# Patient Record
Sex: Male | Born: 1955 | Race: Black or African American | Hispanic: No | Marital: Married | Smoking: Never smoker
Health system: Southern US, Community
[De-identification: ages and names within clinical notes are randomized; demographics above are authoritative.]

## PROBLEM LIST (undated history)

## (undated) DIAGNOSIS — D649 Anemia, unspecified: Secondary | ICD-10-CM

## (undated) DIAGNOSIS — I10 Essential (primary) hypertension: Secondary | ICD-10-CM

## (undated) DIAGNOSIS — C801 Malignant (primary) neoplasm, unspecified: Secondary | ICD-10-CM

## (undated) HISTORY — PX: NECK SURGERY: SHX720

## (undated) HISTORY — DX: Malignant (primary) neoplasm, unspecified: C80.1

---

## 2003-05-14 ENCOUNTER — Encounter (HOSPITAL_COMMUNITY): Admission: RE | Admit: 2003-05-14 | Discharge: 2003-06-13 | Payer: Self-pay | Admitting: Anesthesiology

## 2003-07-09 ENCOUNTER — Encounter: Payer: Self-pay | Admitting: General Surgery

## 2003-07-09 ENCOUNTER — Ambulatory Visit (HOSPITAL_COMMUNITY): Admission: RE | Admit: 2003-07-09 | Discharge: 2003-07-09 | Payer: Self-pay | Admitting: General Surgery

## 2009-02-28 ENCOUNTER — Encounter: Payer: Self-pay | Admitting: Emergency Medicine

## 2009-02-28 ENCOUNTER — Inpatient Hospital Stay (HOSPITAL_COMMUNITY): Admission: EM | Admit: 2009-02-28 | Discharge: 2009-03-02 | Payer: Self-pay | Admitting: Emergency Medicine

## 2009-03-14 ENCOUNTER — Encounter: Admission: RE | Admit: 2009-03-14 | Discharge: 2009-03-14 | Payer: Self-pay | Admitting: Neurosurgery

## 2009-03-15 ENCOUNTER — Emergency Department (HOSPITAL_COMMUNITY): Admission: EM | Admit: 2009-03-15 | Discharge: 2009-03-15 | Payer: Self-pay | Admitting: Emergency Medicine

## 2009-04-02 ENCOUNTER — Encounter: Admission: RE | Admit: 2009-04-02 | Discharge: 2009-04-02 | Payer: Self-pay | Admitting: Neurosurgery

## 2009-05-03 ENCOUNTER — Inpatient Hospital Stay (HOSPITAL_COMMUNITY): Admission: RE | Admit: 2009-05-03 | Discharge: 2009-05-04 | Payer: Self-pay | Admitting: Neurosurgery

## 2009-08-09 ENCOUNTER — Encounter: Admission: RE | Admit: 2009-08-09 | Discharge: 2009-08-09 | Payer: Self-pay | Admitting: Neurosurgery

## 2010-03-06 ENCOUNTER — Emergency Department (HOSPITAL_COMMUNITY): Admission: EM | Admit: 2010-03-06 | Discharge: 2010-03-06 | Payer: Self-pay | Admitting: Emergency Medicine

## 2010-09-19 ENCOUNTER — Emergency Department (HOSPITAL_COMMUNITY)
Admission: EM | Admit: 2010-09-19 | Discharge: 2010-09-19 | Payer: Self-pay | Source: Home / Self Care | Admitting: Emergency Medicine

## 2010-12-07 LAB — CBC
Hemoglobin: 12.4 g/dL — ABNORMAL LOW (ref 13.0–17.0)
RBC: 4.05 MIL/uL — ABNORMAL LOW (ref 4.22–5.81)
RDW: 14.1 % (ref 11.5–15.5)

## 2010-12-07 LAB — DIFFERENTIAL
Basophils Absolute: 0.1 10*3/uL (ref 0.0–0.1)
Lymphocytes Relative: 43 % (ref 12–46)
Monocytes Absolute: 0.5 10*3/uL (ref 0.1–1.0)
Monocytes Relative: 12 % (ref 3–12)
Neutro Abs: 1.5 10*3/uL — ABNORMAL LOW (ref 1.7–7.7)
Neutrophils Relative %: 37 % — ABNORMAL LOW (ref 43–77)

## 2010-12-07 LAB — BASIC METABOLIC PANEL
Calcium: 8.7 mg/dL (ref 8.4–10.5)
GFR calc Af Amer: >60 mL/min (ref >60)
GFR calc non Af Amer: >60 mL/min (ref >60)
Sodium: 135 mEq/L (ref 135–145)

## 2010-12-27 LAB — BASIC METABOLIC PANEL
BUN: 11 mg/dL (ref 6–23)
Chloride: 101 mEq/L (ref 96–112)
Potassium: 4.5 mEq/L (ref 3.5–5.1)
Sodium: 138 mEq/L (ref 135–145)

## 2010-12-27 LAB — CBC
HCT: 39.4 % (ref 39.0–52.0)
Hemoglobin: 13.5 g/dL (ref 13.0–17.0)
MCV: 90 fL (ref 78.0–100.0)
Platelets: 185 10*3/uL (ref 150–400)
WBC: 2.9 10*3/uL — ABNORMAL LOW (ref 4.0–10.5)

## 2010-12-29 LAB — APTT: aPTT: 29 seconds (ref 24–37)

## 2010-12-29 LAB — COMPREHENSIVE METABOLIC PANEL
Albumin: 3.7 g/dL (ref 3.5–5.2)
BUN: 19 mg/dL (ref 6–23)
Calcium: 8.7 mg/dL (ref 8.4–10.5)
Creatinine, Ser: 0.98 mg/dL (ref 0.4–1.5)
Glucose, Bld: 103 mg/dL — ABNORMAL HIGH (ref 70–99)
Potassium: 4.1 mEq/L (ref 3.5–5.1)
Total Protein: 7.6 g/dL (ref 6.0–8.3)

## 2010-12-29 LAB — DIFFERENTIAL
Lymphocytes Relative: 22 % (ref 12–46)
Lymphs Abs: 0.9 10*3/uL (ref 0.7–4.0)
Monocytes Absolute: 0.5 10*3/uL (ref 0.1–1.0)
Monocytes Relative: 12 % (ref 3–12)
Neutro Abs: 2.5 10*3/uL (ref 1.7–7.7)
Neutrophils Relative %: 63 % (ref 43–77)

## 2010-12-29 LAB — CBC
HCT: 39.3 % (ref 39.0–52.0)
Hemoglobin: 13.6 g/dL (ref 13.0–17.0)
MCHC: 34.7 g/dL (ref 30.0–36.0)
MCV: 90.8 fL (ref 78.0–100.0)
Platelets: 189 10*3/uL (ref 150–400)
RDW: 13.8 % (ref 11.5–15.5)

## 2010-12-29 LAB — PROTIME-INR: INR: 1.1 (ref 0.00–1.49)

## 2011-02-03 NOTE — Op Note (Signed)
NAMEJALIEN, Juan Wall               ACCOUNT NO.:  [id number]   MEDICAL RECORD NO.:  [id number]          PATIENT TYPE:  INP   LOCATION:  3534                         FACILITY:  [facility]   PHYSICIAN:  Donalee Citrin, M.D.        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   PROCEDURE:  Anterior cervical diskectomy and fusion at C3-4 using a 7-mm  allograft wedge and a 27-mm Venture plate with four 30-mm variable angle  screws.   SURGEON:  Donalee Citrin, MD   ASSISTANT:  Reinaldo Meeker, MD   ANESTHESIA:  General endotracheal.   HISTORY OF PRESENT ILLNESS:  The patient is a 53-year gentleman, who has  had progressive worsening neck pain, bilateral arm pain with numbness  and tingling in his right arm, and weakness in his right hand.  MRI scan  showed multilevel spondylosis; however, there was a large spondylitic  spur at C3-4 causing significant spinal cord compression and cervical  stenosis with signal change within the cord.  This was felt to be the  source of his symptomatology, and due to the degree of stenosis and its  clinical presentation, the patient was recommended anterior cervical  diskectomy and fusion.  The risks and benefits of the operation were  explained to the patient.  He understood and agreed to proceed forth.   DESCRIPTION OF PROCEDURE:  The patient was brought to the OR, induced  under general anesthesia, positioned supine, and the neck flexed in  extension with 5 pounds of Halter traction.  The right side of the neck  was prepped and draped in usual sterile fashion.  Preop x-ray localized  the appropriate level.  A curvilinear incision was made just off the  midline to the anterior border of the sternocleidomastoid.  The  superficial layer  of the platysma was dissected out and divided  longitudinally.  The avascular plane between the sternocleidomastoid and  strap muscles was developed down to the prevertebral fascia.  Prevertebral fascia was then dissected with Kittners.  Intraoperative x-  ray confirmed localization at the C3-4 disk space, so anterior  osteophytes bitten off C3-4 disk space.  The longus colli was reflected  laterally and self-retaining retractors were placed.  Annulotomy was  then incised and anterior disk was removed.  A 2- and 3-mm Kerrison  punches was used to remove out the anterior osteophytes coming off the  C3 vertebral body, and then a high-speed drill was used to drill down  the disk space down the posterior annulus and osteophyte complex.  At  this point, the operating room microscope was draped, brought into the  field and under microscopic illumination, further drilling was carried  out drilling off the posterior bone spur coming off the C3 vertebral  body.  Then, using a 1-mm Kerrison punch, this was used to get up  underneath the endplates both C3  and C4 removing the posterior annulus,  the large bone spur, and the PLL in piecemeal fashion.  This exposed the  thecal sac, marching across to both C4 pedicles, lateral margins were  identified.  There were some very large spur coming off the C3 vertebral  body, this was all aggressively underbitten decompressing the central  canal and thecal sac.  At the end of the diskectomy, there was no  further stenosis.  Both C4 and foramina were patent, and all posterior  spurs had been aggressively underbitten of both endplates.  Then, a size  7-mm graft was inserted.  Meticulous hemostasis was maintained.  Plate  was sized, and a 27 plate had to be used due to the size of the  interspace and the contouring of vertebral body.  Postop fluoroscopy  confirmed good position of plate, screws, and bone graft.  All screws  had excellent purchase.  Locking  mechanism was engaged.  The plate did  ride up a little bit high on the C3 vertebral body, however, due to the  size of the vertebral body, it was felt it was better to leave it in its  current position rather than try to reposition of the screws inferiorly  as it was felt which may compromise the integrity of the vertebral body.  So, at this point, the wound was copiously irrigated.  Meticulous  hemostasis was maintained.  The wound was closed in layers with  interrupted Vicryl, and the skin was closed with running 4-0  subcuticular.  Benzoin and Steri-Strips applied.  The patient went to  recovery room in stable condition.           ______________________________  Donalee Citrin, M.D.     GC/MEDQ  D:  05/03/2009  T:  05/03/2009  Job:  [id number]
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
## 2011-02-25 ENCOUNTER — Telehealth: Payer: Self-pay

## 2011-02-25 DIAGNOSIS — Z139 Encounter for screening, unspecified: Secondary | ICD-10-CM

## 2011-02-27 NOTE — Telephone Encounter (Signed)
LMOM for pt to call. 

## 2011-03-02 NOTE — Telephone Encounter (Addendum)
Gastroenterology Pre-Procedure Form  Request Date: 02/24/2011  ,  Requesting Physician: Darlene @ [facility]     PATIENT INFORMATION:  Juan Wall is a 55 y.o., male (DOB=03-12-1956).  PROCEDURE: Procedure(s) requested: colonoscopy Procedure Reason: screening for colon cancer  PATIENT REVIEW QUESTIONS: The patient reports the following:   1. Diabetes Melitis: no 2. Joint replacements in the past 12 months: no 3. Major health problems in the past 3 months: no 4. Has an artificial valve or MVP:no 5. Has been advised in past to take antibiotics in advance of a procedure like teeth cleaning: no}    MEDICATIONS & ALLERGIES:    Patient reports the following regarding taking any blood thinners:   Plavix? no Aspirin?no Coumadin?  no  Patient confirms/reports the following medications:  Current Outpatient Prescriptions  Medication Sig Dispense Refill  . losartan-hydrochlorothiazide (HYZAAR) 100-25 MG per tablet Take 1 tablet by mouth daily.        . methadone (DOLOPHINE) 10 MG tablet Take 10 mg by mouth every 6 (six) hours as needed. Takes for chronic nerve damage in his back       . nortriptyline (PAMELOR) 25 MG capsule Take 25 mg by mouth at bedtime. Take two tablets at bedtime         Patient confirms/reports the following allergies:  No Known Allergies  Patient is appropriate to schedule for requested procedure(s): yes  AUTHORIZATION INFORMATION Primary Insurance: Medicare   ID #: [id number] Pre-Cert / Auth required  Secondary Insurance: ,  ID #: ,  Group #:  Pre-Cert / Auth required: Pre-Cert / Auth #:   No orders of the defined types were placed in this encounter.    SCHEDULE INFORMATION: Procedure has been scheduled as follows:  Date: 03/17/2011    Time: 8:15 AM  Location: [facility] Short Stay  This Gastroenterology Pre-Precedure Form is being routed to the following provider(s) for review: Jonette Eva, MD   [facility] FOR KIM THAT PT MAY  NEED PHENERGAN RX AND INSTRUCTIONS MAILED TO PT

## 2011-03-04 NOTE — Telephone Encounter (Signed)
MOVIPREP. MAY NEED PHENERGAN

## 2011-03-17 ENCOUNTER — Ambulatory Visit (HOSPITAL_COMMUNITY): Admission: RE | Admit: 2011-03-17 | Payer: Medicare Other | Source: Ambulatory Visit | Admitting: Gastroenterology

## 2011-03-17 ENCOUNTER — Encounter: Payer: Self-pay | Admitting: Gastroenterology

## 2011-04-03 ENCOUNTER — Telehealth: Payer: Self-pay

## 2011-04-03 NOTE — Telephone Encounter (Signed)
Gastroenterology Pre-Procedure Form  Request Date: 03/04/2011  ,  Requesting Physician:  Agustin Cree @ [facility] in [facility]    PT IS REQUESTING THE PILL PREP   PATIENT INFORMATION:  Juan Wall is a 55 y.o., male (DOB=08-28-1956).  PROCEDURE: Procedure(s) requested: colonoscopy Procedure Reason: screening for colon cancer  PATIENT REVIEW QUESTIONS: The patient reports the following:   1. Diabetes Melitis: no 2. Joint replacements in the past 12 months: no 3. Major health problems in the past 3 months: no 4. Has an artificial valve or MVP:no 5. Has been advised in past to take antibiotics in advance of a procedure like teeth cleaning: no}    MEDICATIONS & ALLERGIES:    Patient reports the following regarding taking any blood thinners:   Plavix? no Aspirin?no Coumadin?  no  Patient confirms/reports the following medications:  Current Outpatient Prescriptions  Medication Sig Dispense Refill  . losartan-hydrochlorothiazide (HYZAAR) 100-25 MG per tablet Take 1 tablet by mouth daily.        . methadone (DOLOPHINE) 10 MG tablet Take 10 mg by mouth every 6 (six) hours as needed. Takes for chronic nerve damage in his back       . nortriptyline (PAMELOR) 25 MG capsule Take 25 mg by mouth at bedtime. Take two tablets at bedtime        Patient confirms/reports the following allergies:  No Known Allergies  Patient is appropriate to schedule for requested procedure(s): yes  AUTHORIZATION INFORMATION Primary Insurance: ,  ID #: ,  Group #:  Pre-Cert / Auth required:  Pre-Cert / Auth #:  Secondary Insurance: ,  ID #: ,  Group #:  Pre-Cert / Auth required: Pre-Cert / Auth #:   No orders of the defined types were placed in this encounter.    SCHEDULE INFORMATION: Procedure has been scheduled as follows:  Date: 04/13/2011  , Time: 12:30 pm  Location: [facility] Short Stay  This Gastroenterology Pre-Precedure Form is being routed to the following provider(s) for  review: Jonette Eva, MD

## 2011-04-04 NOTE — Telephone Encounter (Signed)
osmoprep

## 2011-04-07 MED ORDER — SOD PHOS MONO-SOD PHOS DIBASIC 1.102-0.398 G PO TABS: ORAL_TABLET | ORAL | Status: DC

## 2011-04-07 NOTE — Telephone Encounter (Signed)
[facility] Rx has been sent to [facility] and instructions are being placed in the mail today.

## 2011-04-10 MED ORDER — SODIUM CHLORIDE 0.45 % IV SOLN
Freq: Once | INTRAVENOUS | Status: AC
  Administered 2011-04-13: 12:00:00 via INTRAVENOUS

## 2011-04-13 ENCOUNTER — Ambulatory Visit (HOSPITAL_COMMUNITY)
Admission: RE | Admit: 2011-04-13 | Discharge: 2011-04-13 | Disposition: A | Discharge status: Home / Self Care | Payer: Medicare Other | Source: Ambulatory Visit | Attending: Gastroenterology | Admitting: Gastroenterology

## 2011-04-13 ENCOUNTER — Encounter (HOSPITAL_COMMUNITY)
Admission: RE | Disposition: A | Discharge status: Home / Self Care | Payer: Self-pay | Source: Ambulatory Visit | Attending: Gastroenterology

## 2011-04-13 ENCOUNTER — Encounter: Payer: Medicare Other | Admitting: Gastroenterology

## 2011-04-13 DIAGNOSIS — K648 Other hemorrhoids: Secondary | ICD-10-CM | POA: Insufficient documentation

## 2011-04-13 DIAGNOSIS — K5730 Diverticulosis of large intestine without perforation or abscess without bleeding: Secondary | ICD-10-CM | POA: Insufficient documentation

## 2011-04-13 DIAGNOSIS — Z1211 Encounter for screening for malignant neoplasm of colon: Secondary | ICD-10-CM

## 2011-04-13 DIAGNOSIS — I10 Essential (primary) hypertension: Secondary | ICD-10-CM | POA: Insufficient documentation

## 2011-04-13 DIAGNOSIS — Z79899 Other long term (current) drug therapy: Secondary | ICD-10-CM | POA: Insufficient documentation

## 2011-04-13 HISTORY — PX: COLONOSCOPY: SHX5424

## 2011-04-13 SURGERY — COLONOSCOPY
Anesthesia: Moderate Sedation

## 2011-04-13 MED ORDER — MEPERIDINE HCL 100 MG/ML IJ SOLN
INTRAMUSCULAR | Status: AC
  Filled 2011-04-13: qty 2

## 2011-04-13 MED ORDER — MIDAZOLAM HCL 5 MG/5ML IJ SOLN
INTRAMUSCULAR | Status: DC | PRN
  Administered 2011-04-13 (×2): 2 mg via INTRAVENOUS

## 2011-04-13 MED ORDER — PROMETHAZINE HCL 25 MG/ML IJ SOLN
INTRAMUSCULAR | Status: DC | PRN
  Administered 2011-04-13: 12.5 mg via INTRAVENOUS

## 2011-04-13 MED ORDER — MEPERIDINE HCL 100 MG/ML IJ SOLN
INTRAMUSCULAR | Status: DC | PRN
  Administered 2011-04-13 (×2): 50 mg via INTRAVENOUS

## 2011-04-13 MED ORDER — MIDAZOLAM HCL 5 MG/5ML IJ SOLN
INTRAMUSCULAR | Status: AC
  Filled 2011-04-13: qty 10

## 2011-04-13 NOTE — H&P (Signed)
  Primary Care Physician:  [facility], [facility], [place] Primary Gastroenterologist:  Dr. Darrick Penna  Pre-Procedure History & Physical: HPI:  Juan Wall is a 55 y.o. male is here for a screening colonoscopy.   PMHX: CHRONIC BACK PAIN HTN  No past surgical history on file.  Prior to Admission medications   Medication Sig Start Date End Date Taking? Authorizing Provider  losartan-hydrochlorothiazide (HYZAAR) 100-25 MG per tablet Take 1 tablet by mouth daily.     Yes Historical Provider, MD  methadone (DOLOPHINE) 10 MG tablet Take 10 mg by mouth every 6 (six) hours as needed. Takes for chronic nerve damage in his back    Yes Historical Provider, MD  nortriptyline (PAMELOR) 25 MG capsule Take 25 mg by mouth at bedtime. Take two tablets at bedtime   Yes Historical Provider, MD  sodium phosphates (OSMOPREP) 1.102-0.398 G TABS Use as directed Also buy 1 fleet enema to use as directed 04/07/11   Arlyce Harman, MD    Allergies as of 04/03/2011  . (No Known Allergies)    NO COLON CA OR POLYPS  History   Social History  . Marital Status: Married   Review of Systems: See HPI, otherwise negative ROS  Physical Exam: BP 135/87  Pulse 63  Temp(Src) 98.6 F (37 C) (Oral)  Resp 21  Ht 5\' 9"  (1.753 m)  Wt 84.369 kg (186 lb)  BMI 27.47 kg/m2  SpO2 99% General:   Alert,  pleasant and cooperative in NAD Head:  Normocephalic and atraumatic. Neck:  Supple; no masses or thyromegaly. Lungs:  Clear throughout to auscultation.    Heart:  Regular rate and rhythm. Abdomen:  Soft, nontender and nondistended. Normal bowel sounds, without guarding, and without rebound.   Neurologic:  Alert and  oriented x4;  grossly normal neurologically.  Impression/Plan: Juan Wall is now here to undergo a screening colonoscopy.  Risks, benefits, limitations, and alternatives regarding colonoscopy have been reviewed with the patient. Questions have been answered.

## 2011-04-21 ENCOUNTER — Encounter (HOSPITAL_COMMUNITY): Payer: Self-pay | Admitting: Gastroenterology

## 2012-01-07 DIAGNOSIS — G4700 Insomnia, unspecified: Secondary | ICD-10-CM | POA: Insufficient documentation

## 2012-04-20 DIAGNOSIS — R011 Cardiac murmur, unspecified: Secondary | ICD-10-CM | POA: Insufficient documentation

## 2012-08-26 DIAGNOSIS — M47819 Spondylosis without myelopathy or radiculopathy, site unspecified: Secondary | ICD-10-CM | POA: Insufficient documentation

## 2014-04-06 DIAGNOSIS — M961 Postlaminectomy syndrome, not elsewhere classified: Secondary | ICD-10-CM | POA: Insufficient documentation

## 2015-04-09 DIAGNOSIS — E7800 Pure hypercholesterolemia, unspecified: Secondary | ICD-10-CM | POA: Insufficient documentation

## 2015-07-01 DIAGNOSIS — Z5181 Encounter for therapeutic drug level monitoring: Secondary | ICD-10-CM | POA: Insufficient documentation

## 2015-07-01 DIAGNOSIS — G479 Sleep disorder, unspecified: Secondary | ICD-10-CM | POA: Insufficient documentation

## 2015-08-28 DIAGNOSIS — G4719 Other hypersomnia: Secondary | ICD-10-CM | POA: Insufficient documentation

## 2015-10-10 DIAGNOSIS — Z862 Personal history of diseases of the blood and blood-forming organs and certain disorders involving the immune mechanism: Secondary | ICD-10-CM | POA: Insufficient documentation

## 2015-10-18 DIAGNOSIS — M5442 Lumbago with sciatica, left side: Secondary | ICD-10-CM | POA: Insufficient documentation

## 2015-10-18 DIAGNOSIS — G8929 Other chronic pain: Secondary | ICD-10-CM | POA: Insufficient documentation

## 2015-12-31 ENCOUNTER — Encounter: Payer: Self-pay | Admitting: Gastroenterology

## 2016-05-19 NOTE — Progress Notes (Deleted)
[facility] day:  05/19/2016  Chief Complaint: Juan Wall is a 60 y.o. male with leukopenia who is referred in consultation by Dr. Astrid Divine for assessment and management.  HPI: ***  CBC on 04/09/2016 revealed a hematocrit of 37, hemoglobin 12.8, was 222,000, white count 4300 with an ANC of 1400. Differential included 32% segs, 46% lymphs, 18% monocytes, and 5% eosinophils. Absolute monocyte count was 800.    CBC on 10/10/2015 revealed a hematocrit of 39, hemoglobin 13.5, platelets 238,000 white count 4600 with an ANC of extreme 100. Differential included 44% segs, 43% lymphs, 17% monocytes, and 6% eosinophils. Absolute monocyte count was 800.  Hepatitis C and HIV testing were negative on 10/02/2014.   No past medical history on file.  Past Surgical History:  Procedure Laterality Date  . COLONOSCOPY  04/13/2011   Procedure: COLONOSCOPY;  Surgeon: [name], MD;  Location: [facility];  Service: Endoscopy;  Laterality: N/A;    No family history on file.  Social History:  has no tobacco, alcohol, and drug history on file.  The patient is accompanied by *** alone today.  Allergies: No Known Allergies  Current Medications: Current Outpatient Prescriptions  Medication Sig Dispense Refill  . losartan-hydrochlorothiazide (HYZAAR) 100-25 MG per tablet Take 1 tablet by mouth daily.      . methadone (DOLOPHINE) 10 MG tablet Take 10 mg by mouth every 6 (six) hours as needed. 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 nortriptyline (PAMELOR) 25 MG capsule Take 25 mg by mouth at bedtime. Take two tablets at bedtime     No current facility-administered medications for this visit.     Review of Systems:  GENERAL:  Feels good.  Active.  No fevers, sweats or weight loss. PERFORMANCE STATUS (ECOG):  *** HEENT:  No visual changes, runny nose, sore throat, mouth sores or tenderness. Lungs: No shortness of breath or cough.  No  hemoptysis. Cardiac:  No chest pain, palpitations, orthopnea, or PND. GI:  No nausea, vomiting, diarrhea, constipation, melena or hematochezia. GU:  No urgency, frequency, dysuria, or hematuria. Musculoskeletal:  No back pain.  No joint pain.  No muscle tenderness. Extremities:  No pain or swelling. Skin:  No rashes or skin changes. Neuro:  No headache, numbness or weakness, balance or coordination issues. Endocrine:  No diabetes, thyroid issues, hot flashes or night sweats. Psych:  No mood changes, depression or anxiety. Pain:  No focal pain. Review of systems:  All other systems reviewed and found to be negative.   Physical Exam: There were no vitals taken for this visit. GENERAL:  Well developed, well nourished, sitting comfortably in the exam room in no acute distress. MENTAL STATUS:  Alert and oriented to person, place and time. HEAD:  *** hair.  Normocephalic, atraumatic, face symmetric, no Cushingoid features. EYES:  *** eyes.  Pupils equal round and reactive to light and accomodation.  No conjunctivitis or scleral icterus. ENT:  Oropharynx clear without lesion.  Tongue normal. Mucous membranes moist.  RESPIRATORY:  Clear to auscultation without rales, wheezes or rhonchi. CARDIOVASCULAR:  Regular rate and rhythm without murmur, rub or gallop. ABDOMEN:  Soft, non-tender, with active bowel sounds, and no hepatosplenomegaly.  No masses. SKIN:  No rashes, ulcers or lesions. EXTREMITIES: No edema, no skin discoloration or tenderness.  No palpable cords. LYMPH NODES: No palpable cervical, supraclavicular, axillary or inguinal adenopathy  NEUROLOGICAL: Unremarkable. PSYCH:  Appropriate.  No visits  with results within 3 Day(s) from this visit.  Latest known visit with results is:  Hospital Outpatient Visit on 03/06/2010  Component Date Value Ref Range Status  . Sodium 12/07/2010 135  135 - 145 mEq/L Final  . Potassium 12/07/2010 3.9  3.5 - 5.1 mEq/L Final  . Chloride 12/07/2010 104   96 - 112 mEq/L Final  . CO2 12/07/2010 26  19 - 32 mEq/L Final  . Glucose, Bld 12/07/2010 104* 70 - 99 mg/dL Final  . BUN 12/07/2010 12  6 - 23 mg/dL Final  . Creatinine, Ser 12/07/2010 0.97  0.4 - 1.5 mg/dL Final  . Calcium 12/07/2010 8.7  8.4 - 10.5 mg/dL Final  . GFR calc non Af Amer 12/07/2010 >60  >60 mL/min Final  . GFR calc Af Amer 12/07/2010   >60 mL/min Final                   Value:>60                                The eGFR has been calculated                         using the MDRD equation.                         This calculation has not been                         validated in all clinical                         situations.                         eGFR's persistently                         <60 mL/min signify                         possible Chronic Kidney Disease.  . WBC 12/07/2010 4.1  4.0 - 10.5 K/uL Final  . RBC 12/07/2010 4.05* 4.22 - 5.81 MIL/uL Final  . Hemoglobin 12/07/2010 12.4* 13.0 - 17.0 g/dL Final  . HCT 12/07/2010 36.0* 39.0 - 52.0 % Final  . MCV 12/07/2010 88.9  78.0 - 100.0 fL Final  . MCHC 12/07/2010 34.3  30.0 - 36.0 g/dL Final  . RDW 12/07/2010 14.1  11.5 - 15.5 % Final  . Platelets 12/07/2010 189  150 - 400 K/uL Final  . Neutrophils Relative % 12/07/2010 37* 43 - 77 % Final  . Neutro Abs 12/07/2010 1.5* 1.7 - 7.7 K/uL Final  . Lymphocytes Relative 12/07/2010 43  12 - 46 % Final  . Lymphs Abs 12/07/2010 1.8  0.7 - 4.0 K/uL Final  . Monocytes Relative 12/07/2010 12  3 - 12 % Final  . Monocytes Absolute 12/07/2010 0.5  0.1 - 1.0 K/uL Final  . Eosinophils Relative 12/07/2010 5  0 - 5 % Final  . Eosinophils Absolute 12/07/2010 0.2  0.0 - 0.7 K/uL Final  . Basophils Relative 12/07/2010 2* 0 - 1 % Final  . Basophils Absolute 12/07/2010 0.1  0.0 - 0.1 K/uL Final    Assessment:  Juan Wall   is a 60 y.o. male ***  Plan: 1. *** 2. *** 3. *** 4. *** 5. ***  Lequita Asal, MD  05/19/2016, 2:12 PM

## 2016-05-20 ENCOUNTER — Inpatient Hospital Stay: Payer: Medicare Other | Attending: Hematology and Oncology | Admitting: Hematology and Oncology

## 2016-06-03 ENCOUNTER — Encounter: Payer: Self-pay | Admitting: Hematology and Oncology

## 2016-06-03 ENCOUNTER — Inpatient Hospital Stay: Payer: Medicare Other

## 2016-06-03 ENCOUNTER — Inpatient Hospital Stay: Payer: Medicare Other | Attending: Hematology and Oncology | Admitting: Hematology and Oncology

## 2016-06-03 VITALS — BP 152/90 | HR 84 | Temp 97.9°F | Resp 18 | Ht 69.5 in | Wt 191.1 lb

## 2016-06-03 DIAGNOSIS — D472 Monoclonal gammopathy: Secondary | ICD-10-CM | POA: Insufficient documentation

## 2016-06-03 DIAGNOSIS — K648 Other hemorrhoids: Secondary | ICD-10-CM | POA: Insufficient documentation

## 2016-06-03 DIAGNOSIS — M549 Dorsalgia, unspecified: Secondary | ICD-10-CM | POA: Insufficient documentation

## 2016-06-03 DIAGNOSIS — G8929 Other chronic pain: Secondary | ICD-10-CM | POA: Diagnosis not present

## 2016-06-03 DIAGNOSIS — D72819 Decreased white blood cell count, unspecified: Secondary | ICD-10-CM | POA: Diagnosis not present

## 2016-06-03 DIAGNOSIS — Z79899 Other long term (current) drug therapy: Secondary | ICD-10-CM | POA: Diagnosis not present

## 2016-06-03 DIAGNOSIS — K5730 Diverticulosis of large intestine without perforation or abscess without bleeding: Secondary | ICD-10-CM | POA: Diagnosis not present

## 2016-06-03 DIAGNOSIS — Z87891 Personal history of nicotine dependence: Secondary | ICD-10-CM | POA: Insufficient documentation

## 2016-06-03 DIAGNOSIS — D649 Anemia, unspecified: Secondary | ICD-10-CM | POA: Diagnosis not present

## 2016-06-03 LAB — RETICULOCYTES
RBC.: 4.37 MIL/uL — ABNORMAL LOW (ref 4.40–5.90)
Retic Count, Absolute: 48.1 10*3/uL (ref 19.0–183.0)
Retic Ct Pct: 1.1 % (ref 0.4–3.1)

## 2016-06-03 LAB — COMPREHENSIVE METABOLIC PANEL
ALT: 13 U/L — ABNORMAL LOW (ref 17–63)
AST: 17 U/L (ref 15–41)
Albumin: 4.2 g/dL (ref 3.5–5.0)
Alkaline Phosphatase: 63 U/L (ref 38–126)
Anion gap: 4 — ABNORMAL LOW (ref 5–15)
BUN: 12 mg/dL (ref 6–20)
CO2: 29 mmol/L (ref 22–32)
Calcium: 8.9 mg/dL (ref 8.9–10.3)
Chloride: 102 mmol/L (ref 101–111)
Creatinine, Ser: 1.08 mg/dL (ref 0.61–1.24)
GFR calc Af Amer: >60 mL/min (ref >60)
GFR calc non Af Amer: >60 mL/min (ref >60)
Glucose, Bld: 99 mg/dL (ref 65–99)
Potassium: 3.9 mmol/L (ref 3.5–5.1)
Sodium: 135 mmol/L (ref 135–145)
Total Bilirubin: 0.7 mg/dL (ref 0.3–1.2)
Total Protein: 8.2 g/dL — ABNORMAL HIGH (ref 6.5–8.1)

## 2016-06-03 LAB — TSH: TSH: 3.148 u[IU]/mL (ref 0.350–4.500)

## 2016-06-03 LAB — IRON AND TIBC
Iron: 52 ug/dL (ref 45–182)
Saturation Ratios: 19 % (ref 17.9–39.5)
TIBC: 276 ug/dL (ref 250–450)
UIBC: 224 ug/dL

## 2016-06-03 LAB — CBC WITH DIFFERENTIAL/PLATELET
Basophils Absolute: 0 10*3/uL (ref 0–0.1)
Basophils Relative: 1 %
Eosinophils Absolute: 0.1 10*3/uL (ref 0–0.7)
Eosinophils Relative: 5 %
HCT: 39.8 % — ABNORMAL LOW (ref 40.0–52.0)
Hemoglobin: 13.3 g/dL (ref 13.0–18.0)
Lymphocytes Relative: 39 %
Lymphs Abs: 1.2 10*3/uL (ref 1.0–3.6)
MCH: 29.7 pg (ref 26.0–34.0)
MCHC: 33.4 g/dL (ref 32.0–36.0)
MCV: 88.9 fL (ref 80.0–100.0)
Monocytes Absolute: 0.4 10*3/uL (ref 0.2–1.0)
Monocytes Relative: 12 %
Neutro Abs: 1.3 10*3/uL — ABNORMAL LOW (ref 1.4–6.5)
Neutrophils Relative %: 43 %
Platelets: 227 10*3/uL (ref 150–440)
RBC: 4.48 MIL/uL (ref 4.40–5.90)
RDW: 13.8 % (ref 11.5–14.5)
WBC: 3.1 10*3/uL — ABNORMAL LOW (ref 3.8–10.6)

## 2016-06-03 LAB — LACTATE DEHYDROGENASE: LDH: 137 U/L (ref 98–192)

## 2016-06-03 LAB — FOLATE: Folate: 24 ng/mL (ref >5.9)

## 2016-06-03 LAB — FERRITIN: Ferritin: 96 ng/mL (ref 24–336)

## 2016-06-03 NOTE — Progress Notes (Signed)
Patient here today as new evaluation regarding decreased WBC.  Referred by Dr. Astrid Divine.

## 2016-06-03 NOTE — Progress Notes (Signed)
[facility] day:  06/03/2016  Chief Complaint: Juan Wall is a 60 y.o. male with anemia and leukopenia who is referred in consultation by Dr. Gayland Curry for assessment and management.  HPI:  The patient notes no significant past medical history except for trauma in 1994 after falling from a height and landing on his back onto a concrete step.   With recent lab work, he was notes to have mild anemia and leukopenia.    CBC on 04/09/2016 revealed a hematocrit of 37, hemoglobin 12.8, was 222,000, white count 4300 with an ANC of 1400. Differential included 32% segs, 46% lymphs, 18% monocytes, and 5% eosinophils. Absolute monocyte count was 800.    CBC on 10/10/2015 revealed a hematocrit of 39.2, hemoglobin 13.5, platelets 238,000 white count 4600 with an ANC of 1600. Differential included 44% segs, 43% lymphs, 17% monocytes, and 6% eosinophils. Absolute monocyte count was 800.  CBC on 03/09/2014 revealed a hematocrit of 43, hemoglobin 14.6, WBC 3800 with an ANC of 1700.  Differential included 46% segs, 39% lymphs, 10% monocytes, 4% eosinophils, and 1% basophils.  Absolute monocyte count was 400.  CBC on 06/06/2013 revealed a hematocrit of 44, hemoglobin 15.0, WBC 3900 with an ANC of 1500.  Differential included 38% segs, 49% lymphs, 11% monocytes, and 2% eosinophils.  Absolute monocyte count was 400.  Hepatitis C and HIV testing were negative on 10/02/2014.  He denies any new medications or herbal products. He denies any fevers or infections.  He denies any sweats or weight loss.  He denies any alcohol or tobacco.  Regarding his diet, he states that it is stable. He does not eat breakfast. He will eat chicken twice a week, hamburger once a week and pork 1-2 times a week. He eats salads "now and then".  He craves sweets.  Patient denies any melena or hematochezia. He had a colonoscopy by Dr. Barney Drain on 04/13/2011.  He was noted to have  sigmoid diverticulosis and internal hemorrhoids.   History reviewed. No pertinent past medical history.  Past Surgical History:  Procedure Laterality Date  . COLONOSCOPY  04/13/2011   Procedure: COLONOSCOPY;  Surgeon: [name], MD;  Location: [facility];  Service: Endoscopy;  Laterality: N/A;    History reviewed. No pertinent family history.  Social History:  has no tobacco, alcohol, and drug history on file.  The patient lives in [place], [place].  The patient is alone today.  Allergies:  Allergies  Allergen Reactions  . Pregabalin Shortness Of Breath  . Atorvastatin Other (See Comments)    Current Medications: Current Outpatient Prescriptions  Medication Sig Dispense Refill  . amLODipine (NORVASC) 10 MG tablet Take by mouth.    . losartan-hydrochlorothiazide (HYZAAR) 100-25 MG per tablet Take 1 tablet by mouth daily.      . methadone (DOLOPHINE) 10 MG tablet Take 10 mg by mouth every 6 (six) hours as needed. 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 methadone (DOLOPHINE) 10 MG tablet Take by mouth.    . nortriptyline (PAMELOR) 25 MG capsule Take 25 mg by mouth at bedtime. Take two tablets at bedtime    . gabapentin (NEURONTIN) 600 MG tablet   9   No current facility-administered medications for this visit.     Review of Systems:  GENERAL:  Feels "ok".  Fever "now and then" at night.  No drenching sweats.  No weight loss. PERFORMANCE STATUS (ECOG):  1 HEENT:  No visual changes, runny nose, sore throat, mouth sores or tenderness. Lungs: No shortness of breath or cough.  No hemoptysis. Cardiac:  No chest pain, palpitations, orthopnea, or PND. GI:  No nausea, vomiting, diarrhea, constipation, melena or hematochezia. GU:  No urgency, frequency, dysuria, or hematuria. Musculoskeletal:  No back pain.  No joint pain.  No muscle tenderness. Extremities:  No pain or swelling. Skin:  No rashes or skin changes. Neuro:  Headache now and then  No numbness or weakness,  balance or coordination issues. Endocrine:  No diabetes, thyroid issues, hot flashes or night sweats. Psych:  No mood changes, depression or anxiety. Pain:  No focal pain. Review of systems:  All other systems reviewed and found to be negative.  Physical Exam: Blood pressure (!) 152/90, pulse 84, temperature 97.9 F (36.6 C), temperature source Tympanic, resp. rate 18, height 5' 9.5" (1.765 m), weight 191 lb 2.2 oz (86.7 kg). GENERAL:  Well developed, well nourished, gentleman sitting comfortably in the exam room with his left leg stretched out in no acute distress. MENTAL STATUS:  Alert and oriented to person, place and time. HEAD:  Wearing a bandana.  Black hair with goatee.  Normocephalic, atraumatic, face symmetric, no Cushingoid features. EYES:  Brown eyes.  Pupils equal round and reactive to light and accomodation.  No conjunctivitis or scleral icterus. ENT:  Oropharynx clear without lesion.  Poor dentition.  Tongue normal. Mucous membranes moist.  RESPIRATORY:  Clear to auscultation without rales, wheezes or rhonchi. CARDIOVASCULAR:  Regular rate and rhythm without murmur, rub or gallop. ABDOMEN:  Soft, non-tender, with active bowel sounds, and no appreciable hepatosplenomegaly.  No masses. SKIN:  No rashes, ulcers or lesions. EXTREMITIES: No edema, no skin discoloration or tenderness.  No palpable cords. LYMPH NODES: Questionable bilateral axillary fullness.  No palpable cervical, supraclavicular, or inguinal adenopathy  NEUROLOGICAL: Unremarkable. PSYCH:  Appropriate.   No visits with results within 3 Day(s) from this visit.  Latest known visit with results is:  Hospital Outpatient Visit on 03/06/2010  Component Date Value Ref Range Status  . Sodium 12/07/2010 135  135 - 145 mEq/L Final  . Potassium 12/07/2010 3.9  3.5 - 5.1 mEq/L Final  . Chloride 12/07/2010 104  96 - 112 mEq/L Final  . CO2 12/07/2010 26  19 - 32 mEq/L Final  . Glucose, Bld 12/07/2010 104* 70 - 99 mg/dL  Final  . BUN 12/07/2010 12  6 - 23 mg/dL Final  . Creatinine, Ser 12/07/2010 0.97  0.4 - 1.5 mg/dL Final  . Calcium 12/07/2010 8.7  8.4 - 10.5 mg/dL Final  . GFR calc non Af Amer 12/07/2010 >60  >60 mL/min Final  . GFR calc Af Amer 12/07/2010   >60 mL/min Final                   Value:>60                                The eGFR has been calculated                         using the MDRD equation.                         This calculation has not been  validated in all clinical                         situations.                         eGFR's persistently                         <60 mL/min signify                         possible Chronic Kidney Disease.  . WBC 12/07/2010 4.1  4.0 - 10.5 K/uL Final  . RBC 12/07/2010 4.05* 4.22 - 5.81 MIL/uL Final  . Hemoglobin 12/07/2010 12.4* 13.0 - 17.0 g/dL Final  . HCT 12/07/2010 36.0* 39.0 - 52.0 % Final  . MCV 12/07/2010 88.9  78.0 - 100.0 fL Final  . MCHC 12/07/2010 34.3  30.0 - 36.0 g/dL Final  . RDW 12/07/2010 14.1  11.5 - 15.5 % Final  . Platelets 12/07/2010 189  150 - 400 K/uL Final  . Neutrophils Relative % 12/07/2010 37* 43 - 77 % Final  . Neutro Abs 12/07/2010 1.5* 1.7 - 7.7 K/uL Final  . Lymphocytes Relative 12/07/2010 43  12 - 46 % Final  . Lymphs Abs 12/07/2010 1.8  0.7 - 4.0 K/uL Final  . Monocytes Relative 12/07/2010 12  3 - 12 % Final  . Monocytes Absolute 12/07/2010 0.5  0.1 - 1.0 K/uL Final  . Eosinophils Relative 12/07/2010 5  0 - 5 % Final  . Eosinophils Absolute 12/07/2010 0.2  0.0 - 0.7 K/uL Final  . Basophils Relative 12/07/2010 2* 0 - 1 % Final  . Basophils Absolute 12/07/2010 0.1  0.0 - 0.1 K/uL Final    Assessment:  Juan Wall is a 60 y.o. male with new normocytic anemia since 2017.  He has mild leukopenia.  Diet is good.  He denies any new medications or herbal products. He denies any fevers or infections.  He denies any sweats or weight loss.  He denies any alcohol or tobacco.  Colonoscopy  on 04/13/2011 revealed sigmoid diverticulosis and internal hemorrhoids.  He denies any melena or hematochezia.   Hepatitis C and HIV testing were negative on 10/02/2014.  Symptomatically, he has chronic back pain.  He has rare fevers at night.  Weight is stable.  Exam reveals bilateral axillary fullness.  Plan: 1.  Discuss anemia and leukopenia.  Discuss differential.  Discuss laboratory evaluation. 2.  Labs today:  CBC with diff, CMP, retic, ferritin, iron stores, B12, folate, TSH, hepatitis B core antibody, ANA with reflex, SPEP, free light chains, LDH. 3.  Schedule abdominal ultrasound: assess liver and spleen. 4.  RTC in 1 week for review of work-up and discussion regarding direction of therapy.   Lequita Asal, MD  06/03/2016, 3:33 PM

## 2016-06-04 LAB — ANA W/REFLEX IF POSITIVE: Anti Nuclear Antibody(ANA): NEGATIVE

## 2016-06-04 LAB — PROTEIN ELECTROPHORESIS, SERUM
A/G Ratio: 1.1 (ref 0.7–1.7)
Albumin ELP: 4.1 g/dL (ref 2.9–4.4)
Alpha-1-Globulin: 0.2 g/dL (ref 0.0–0.4)
Alpha-2-Globulin: 0.5 g/dL (ref 0.4–1.0)
Beta Globulin: 1 g/dL (ref 0.7–1.3)
Gamma Globulin: 2 g/dL — ABNORMAL HIGH (ref 0.4–1.8)
Globulin, Total: 3.7 g/dL (ref 2.2–3.9)
M-Spike, %: 1.5 g/dL — ABNORMAL HIGH
Total Protein ELP: 7.8 g/dL (ref 6.0–8.5)

## 2016-06-04 LAB — VITAMIN B12: Vitamin B-12: 1463 pg/mL — ABNORMAL HIGH (ref 180–914)

## 2016-06-04 LAB — KAPPA/LAMBDA LIGHT CHAINS
Kappa free light chain: 15.6 mg/L (ref 3.3–19.4)
Kappa, lambda light chain ratio: 0.01 — ABNORMAL LOW (ref 0.26–1.65)
Lambda free light chains: 1056.1 mg/L — ABNORMAL HIGH (ref 5.7–26.3)

## 2016-06-04 LAB — HEPATITIS B CORE ANTIBODY, TOTAL: Hep B Core Total Ab: NEGATIVE

## 2016-06-06 ENCOUNTER — Encounter: Payer: Self-pay | Admitting: Hematology and Oncology

## 2016-06-08 LAB — PATHOLOGIST SMEAR REVIEW

## 2016-06-09 ENCOUNTER — Ambulatory Visit
Admission: RE | Admit: 2016-06-09 | Discharge: 2016-06-09 | Disposition: A | Discharge status: Home / Self Care | Payer: Medicare Other | Source: Ambulatory Visit | Attending: Hematology and Oncology | Admitting: Hematology and Oncology

## 2016-06-09 DIAGNOSIS — D72819 Decreased white blood cell count, unspecified: Secondary | ICD-10-CM | POA: Insufficient documentation

## 2016-06-10 ENCOUNTER — Inpatient Hospital Stay: Payer: Medicare Other | Admitting: Hematology and Oncology

## 2016-06-10 NOTE — Progress Notes (Unsigned)
[facility] day:  06/10/2016  Chief Complaint: Juan Wall is a 60 y.o. male with anemia and leukopenia who is seen for review of work-up and discussion regarding direction of therapy.  HPI:  The patient was last seen in the medical oncology clinic on 06/03/2016.  At that time, he was seen for initial consultation regarding anemia and leukopenia.  Diet was good.  He denied any new medications or herbal products. He denied any alcohol or tobacco.  He denied any fevers or infections.  He denied any sweats or weight loss.    Colonoscopy was negative.  He denied any melena or hematochezia.  Symptomatically, he had chronic back pain.  He had rare fevers at night.  Weight was stable.  Exam revealed bilateral axillary fullness.  He underwent a work-up.  CBC revealed a hematocrit of 39.8, hemoglobin 13.3, platelets 227,000, white count 3100 with an ANC of 1300.  CMP revealed a protein of 8.2 (6.5-8.1), albumen 4.2, creatinine 1.08, and calcium 8.9.  Reticulocyte count was 1.1%.  Ferritin was 96.  Iron studies revealed a saturation of 19% and TIBC 276.  B12 was 1463.  Folate was 24.  TSh was 3.148.  Hepatitis B core antibody was negative.  ANA was negative.  LDH was 137.  SPEP revealed a 1.5 gm/dL monoclonal spike in the gamma region.  Lamda free light chains were 1,056.1 (5.7-26.3), kappa free light chains 15.6 with a kappa/lambda ratio of 0.01 (0.26-1.65).  Abdominal ultrasound on 06/09/2016 revealed a normal liver and spleen.  No past medical history on file.  Past Surgical History:  Procedure Laterality Date  . COLONOSCOPY  04/13/2011   Procedure: COLONOSCOPY;  Surgeon: [name], MD;  Location[facility];  Service: Endoscopy;  Laterality: N/A;    No family history on file.  Social History:  reports that he quit smoking about 47 years ago. He has never used smokeless tobacco. He reports that he does not drink alcohol. His drug history is  not on file.  The patient lives in [place], [place].  The patient is alone today.  Allergies:  Allergies  Allergen Reactions  . Pregabalin Shortness Of Breath  . Atorvastatin Other (See Comments)    Current Medications: Current Outpatient Prescriptions  Medication Sig Dispense Refill  . amLODipine (NORVASC) 10 MG tablet Take by mouth.    . gabapentin (NEURONTIN) 600 MG tablet   9  . losartan-hydrochlorothiazide (HYZAAR) 100-25 MG per tablet Take 1 tablet by mouth daily.      . methadone (DOLOPHINE) 10 MG tablet Take 10 mg by mouth every 6 (six) hours as needed. 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 methadone (DOLOPHINE) 10 MG tablet Take by mouth.    . nortriptyline (PAMELOR) 25 MG capsule Take 25 mg by mouth at bedtime. Take two tablets at bedtime     No current facility-administered medications for this visit.     Review of Systems:  GENERAL:  Feels "ok".  Fever "now and then" at night.  No drenching sweats.  No weight loss. PERFORMANCE STATUS (ECOG):  1 HEENT:  No visual changes, runny nose, sore throat, mouth sores or tenderness. Lungs: No shortness of breath or cough.  No hemoptysis. Cardiac:  No chest pain, palpitations, orthopnea, or PND. GI:  No nausea, vomiting, diarrhea, constipation, melena or hematochezia. GU:  No urgency, frequency, dysuria, or hematuria. Musculoskeletal:  No back pain.  No joint pain.  No muscle tenderness. Extremities:  No pain or swelling. Skin:  No rashes or skin changes. Neuro:  Headache now and then  No numbness or weakness, balance or coordination issues. Endocrine:  No diabetes, thyroid issues, hot flashes or night sweats. Psych:  No mood changes, depression or anxiety. Pain:  No focal pain. Review of systems:  All other systems reviewed and found to be negative.  Physical Exam: There were no vitals taken for this visit. GENERAL:  Well developed, well nourished, gentleman sitting comfortably in the exam room with his left leg  stretched out in no acute distress. MENTAL STATUS:  Alert and oriented to person, place and time. HEAD:  Wearing a bandana.  Black hair with goatee.  Normocephalic, atraumatic, face symmetric, no Cushingoid features. EYES:  Brown eyes.  Pupils equal round and reactive to light and accomodation.  No conjunctivitis or scleral icterus. ENT:  Oropharynx clear without lesion.  Poor dentition.  Tongue normal. Mucous membranes moist.  RESPIRATORY:  Clear to auscultation without rales, wheezes or rhonchi. CARDIOVASCULAR:  Regular rate and rhythm without murmur, rub or gallop. ABDOMEN:  Soft, non-tender, with active bowel sounds, and no appreciable hepatosplenomegaly.  No masses. SKIN:  No rashes, ulcers or lesions. EXTREMITIES: No edema, no skin discoloration or tenderness.  No palpable cords. LYMPH NODES: Questionable bilateral axillary fullness.  No palpable cervical, supraclavicular, or inguinal adenopathy  NEUROLOGICAL: Unremarkable. PSYCH:  Appropriate.   No visits with results within 3 Day(s) from this visit.  Latest known visit with results is:  Office Visit on 06/03/2016  Component Date Value Ref Range Status  . WBC 06/03/2016 3.1* 3.8 - 10.6 K/uL Final  . RBC 06/03/2016 4.48  4.40 - 5.90 MIL/uL Final  . Hemoglobin 06/03/2016 13.3  13.0 - 18.0 g/dL Final  . HCT 06/03/2016 39.8* 40.0 - 52.0 % Final  . MCV 06/03/2016 88.9  80.0 - 100.0 fL Final  . MCH 06/03/2016 29.7  26.0 - 34.0 pg Final  . MCHC 06/03/2016 33.4  32.0 - 36.0 g/dL Final  . RDW 06/03/2016 13.8  11.5 - 14.5 % Final  . Platelets 06/03/2016 227  150 - 440 K/uL Final  . Neutrophils Relative % 06/03/2016 43%  % Final  . Neutro Abs 06/03/2016 1.3* 1.4 - 6.5 K/uL Final  . Lymphocytes Relative 06/03/2016 39%  % Final  . Lymphs Abs 06/03/2016 1.2  1.0 - 3.6 K/uL Final  . Monocytes Relative 06/03/2016 12%  % Final  . Monocytes Absolute 06/03/2016 0.4  0.2 - 1.0 K/uL Final  . Eosinophils Relative 06/03/2016 5%  % Final  .  Eosinophils Absolute 06/03/2016 0.1  0 - 0.7 K/uL Final  . Basophils Relative 06/03/2016 1%  % Final  . Basophils Absolute 06/03/2016 0.0  0 - 0.1 K/uL Final  . Sodium 06/03/2016 135  135 - 145 mmol/L Final  . Potassium 06/03/2016 3.9  3.5 - 5.1 mmol/L Final  . Chloride 06/03/2016 102  101 - 111 mmol/L Final  . CO2 06/03/2016 29  22 - 32 mmol/L Final  . Glucose, Bld 06/03/2016 99  65 - 99 mg/dL Final  . BUN 06/03/2016 12  6 - 20 mg/dL Final  . Creatinine, Ser 06/03/2016 1.08  0.61 - 1.24 mg/dL Final  . Calcium 06/03/2016 8.9  8.9 - 10.3 mg/dL Final  . Total Protein 06/03/2016 8.2* 6.5 - 8.1 g/dL Final  . Albumin 06/03/2016 4.2  3.5 - 5.0 g/dL Final  . AST 06/03/2016 17  15 - 41 U/L Final  .  ALT 06/03/2016 13* 17 - 63 U/L Final  . Alkaline Phosphatase 06/03/2016 63  38 - 126 U/L Final  . Total Bilirubin 06/03/2016 0.7  0.3 - 1.2 mg/dL Final  . GFR calc non Af Amer 06/03/2016 >60  >60 mL/min Final  . GFR calc Af Amer 06/03/2016 >60  >60 mL/min Final   Comment: (NOTE) The eGFR has been calculated using the CKD EPI equation. This calculation has not been validated in all clinical situations. eGFR's persistently <60 mL/min signify possible Chronic Kidney Disease.   . Anion gap 06/03/2016 4* 5 - 15 Final  . Retic Ct Pct 06/03/2016 1.1  0.4 - 3.1 % Final  . RBC. 06/03/2016 4.37* 4.40 - 5.90 MIL/uL Final  . Retic Count, Manual 06/03/2016 48.1  19.0 - 183.0 K/uL Final  . Ferritin 06/03/2016 96  24 - 336 ng/mL Final  . Iron 06/03/2016 52  45 - 182 ug/dL Final  . TIBC 06/03/2016 276  250 - 450 ug/dL Final  . Saturation Ratios 06/03/2016 19  17.9 - 39.5 % Final  . UIBC 06/03/2016 224  ug/dL Final  . Vitamin B-12 06/04/2016 1463* 180 - 914 pg/mL Final   Comment: (NOTE) This assay is not validated for testing neonatal or myeloproliferative syndrome specimens for Vitamin B12 levels. Performed at [facility]   . Folate 06/03/2016 24.0  >5.9 ng/mL Final  . TSH 06/03/2016 3.148  0.350  - 4.500 uIU/mL Final  . Hep B Core Total Ab 06/04/2016 Negative  Negative Final   Comment: (NOTE) Performed At: [facility] [place], [place] [id number] Lindon Romp MD [id number]   . Anit Nuclear Antibody(ANA) 06/04/2016 Negative  Negative Final   Comment: (NOTE) Performed At: [facility] [place], [place] [id number] Lindon Romp MD [id number]   . Path Review 06/08/2016 Peripheral blood smear reveals mild leukopenia with neutropenia.   Final   Comment: Platelet and RBC indices and morphologies are unremarkable. Recent lab results reveals a M spike with lambda free light chains. Hematology service is following.  Reviewed by Dellia Nims Rubinas, M.D.   . Total Protein ELP 06/04/2016 7.8  6.0 - 8.5 g/dL Final  . Albumin ELP 06/04/2016 4.1  2.9 - 4.4 g/dL Final  . Alpha-1-Globulin 06/04/2016 0.2  0.0 - 0.4 g/dL Final  . Alpha-2-Globulin 06/04/2016 0.5  0.4 - 1.0 g/dL Final  . Beta Globulin 06/04/2016 1.0  0.7 - 1.3 g/dL Final  . Gamma Globulin 06/04/2016 2.0* 0.4 - 1.8 g/dL Final  . M-Spike, % 06/04/2016 1.5* Not Observed g/dL Final  . SPE Interp. 06/04/2016 Comment   Final   Comment: (NOTE) The SPE pattern demonstrates a single peak (M-spike) in the gamma region which may represent monoclonal protein. This peak may also be caused by circulating immune complexes, cryoglobulins, C-reactive protein, fibrinogen or hemolysis.  If clinically indicated, the presence of a monoclonal gammopathy may be confirmed by immuno- fixation, as well as an evaluation of the urine for the presence of Bence-Jones protein. Performed At: [facility] [place], [place] [id number] Lindon Romp MD [id number]   . Comment 06/04/2016 Comment   Final   Comment: (NOTE) Protein electrophoresis scan will follow via computer, mail, or courier delivery.   [name] GLOBULIN, TOTAL 06/04/2016 3.7  2.2 - 3.9 g/dL Corrected  . A/G Ratio  06/04/2016 1.1  0.7 - 1.7 Corrected  . Kappa free light chain 06/04/2016 15.6  3.3 - 19.4 mg/L Final  .  Lamda free light chains 06/04/2016 1056.1* 5.7 - 26.3 mg/L Final  . Kappa, lamda light chain ratio 06/04/2016 0.01* 0.26 - 1.65 Final   Comment: (NOTE) Performed At: [facility] [place], [place] [id number] Lindon Romp MD [id number]   . LDH 06/03/2016 137  98 - 192 U/L Final    Assessment:  Juan Wall is a 60 y.o. male with new normocytic anemia since 2017.  He has mild leukopenia.  Diet is good.  He denies any new medications or herbal products. He denies any fevers or infections.  He denies any sweats or weight loss.  He denies any alcohol or tobacco.  Colonoscopy on 04/13/2011 revealed sigmoid diverticulosis and internal hemorrhoids.  He denies any melena or hematochezia.   Hepatitis C and HIV testing were negative on 10/02/2014.  Symptomatically, he has chronic back pain.  He has rare fevers at night.  Weight is stable.  Exam reveals bilateral axillary fullness.  Plan: 1.      Lequita Asal, MD  06/10/2016, 5:42 AM

## 2016-06-17 ENCOUNTER — Encounter: Payer: Self-pay | Admitting: Hematology and Oncology

## 2016-06-17 ENCOUNTER — Inpatient Hospital Stay (HOSPITAL_BASED_OUTPATIENT_CLINIC_OR_DEPARTMENT_OTHER): Payer: Medicare Other | Admitting: Hematology and Oncology

## 2016-06-17 ENCOUNTER — Ambulatory Visit
Admission: RE | Admit: 2016-06-17 | Discharge: 2016-06-17 | Disposition: A | Discharge status: Home / Self Care | Payer: Medicare Other | Source: Ambulatory Visit | Attending: Hematology and Oncology | Admitting: Hematology and Oncology

## 2016-06-17 VITALS — BP 134/83 | HR 67 | Temp 97.4°F | Wt 189.0 lb

## 2016-06-17 DIAGNOSIS — M549 Dorsalgia, unspecified: Secondary | ICD-10-CM | POA: Diagnosis not present

## 2016-06-17 DIAGNOSIS — D72819 Decreased white blood cell count, unspecified: Secondary | ICD-10-CM | POA: Diagnosis not present

## 2016-06-17 DIAGNOSIS — K5730 Diverticulosis of large intestine without perforation or abscess without bleeding: Secondary | ICD-10-CM

## 2016-06-17 DIAGNOSIS — Z79899 Other long term (current) drug therapy: Secondary | ICD-10-CM

## 2016-06-17 DIAGNOSIS — G8929 Other chronic pain: Secondary | ICD-10-CM

## 2016-06-17 DIAGNOSIS — D472 Monoclonal gammopathy: Secondary | ICD-10-CM | POA: Diagnosis not present

## 2016-06-17 DIAGNOSIS — D649 Anemia, unspecified: Secondary | ICD-10-CM

## 2016-06-17 DIAGNOSIS — Z87891 Personal history of nicotine dependence: Secondary | ICD-10-CM

## 2016-06-17 DIAGNOSIS — K648 Other hemorrhoids: Secondary | ICD-10-CM

## 2016-06-17 DIAGNOSIS — R937 Abnormal findings on diagnostic imaging of other parts of musculoskeletal system: Secondary | ICD-10-CM | POA: Diagnosis not present

## 2016-06-17 DIAGNOSIS — Z981 Arthrodesis status: Secondary | ICD-10-CM | POA: Diagnosis not present

## 2016-06-17 NOTE — Progress Notes (Signed)
Patient ambulates without assistance, brought to exam room 3, accompanied by his family member.  Patient c/o chronic lower back pain 10 oof 10.  BP 149/90, vitals documented

## 2016-06-17 NOTE — Progress Notes (Signed)
[facility] day:  06/17/2016  Chief Complaint: DORSE LOCY is a 60 y.o. male with anemia and leukopenia who is seen for review of work-up and discussion regarding direction of therapy.  HPI:  The patient was last seen in the medical oncology clinic on 06/03/2016.  At that time, he was seen for initial consultation regarding anemia and leukopenia.  Diet was good.  He denied any new medications or herbal products. He denied any alcohol or tobacco.  He denied any fevers or infections.  He denied any sweats or weight loss.    Colonoscopy in 2012 was negative.  He denied any melena or hematochezia.  Symptomatically, he had chronic back pain.  He had rare fevers at night.  Weight was stable.  Exam revealed bilateral axillary fullness.  He underwent a work-up.  CBC revealed a hematocrit of 39.8, hemoglobin 13.3, platelets 227,000, white count 3100 with an ANC of 1300.  CMP revealed a protein of 8.2 (6.5-8.1), albumen 4.2, creatinine 1.08, and calcium 8.9.  Reticulocyte count was 1.1%.  Ferritin was 96.  Iron studies revealed a saturation of 19% and TIBC 276.  B12 was 1463.  Folate was 24.  TSH was 3.148.  Hepatitis B core antibody was negative.  ANA was negative.  LDH was 137.  SPEP revealed a 1.5 gm/dL monoclonal spike in the gamma region.  Lambda free light chains were 1,056.1 (5.7 - 26.3), kappa free light chains 15.6 with a kappa/lambda ratio of 0.01 (0.26-1.65).  Abdominal ultrasound on 06/09/2016 revealed a normal liver and spleen.  Symptomatically, he notes chronic back pain post a fall onto a concrete step.  History reviewed. No pertinent past medical history.  Past Surgical History:  Procedure Laterality Date  . COLONOSCOPY  04/13/2011   Procedure: COLONOSCOPY;  Surgeon: [name], MD;  Location: [facility];  Service: Endoscopy;  Laterality: N/A;    History reviewed. No pertinent family history.  Social History:  reports that he  quit smoking about 47 years ago. He has never used smokeless tobacco. He reports that he does not drink alcohol. His drug history is not on file.  The patient lives in [place], [place].  The patient is accompanied by his girlfriend, Darrick Penna, today.  Allergies:  Allergies  Allergen Reactions  . Pregabalin Shortness Of Breath  . Atorvastatin Other (See Comments)    Current Medications: Current Outpatient Prescriptions  Medication Sig Dispense Refill  . amLODipine (NORVASC) 10 MG tablet Take by mouth.    . gabapentin (NEURONTIN) 600 MG tablet   9  . losartan-hydrochlorothiazide (HYZAAR) 100-25 MG per tablet Take 1 tablet by mouth daily.      . methadone (DOLOPHINE) 10 MG tablet Take 10 mg by mouth every 6 (six) hours as needed. 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 nortriptyline (PAMELOR) 25 MG capsule Take 25 mg by mouth at bedtime. Take two tablets at bedtime     No current facility-administered medications for this visit.     Review of Systems:  GENERAL:  Feels "the same".  No fever.  No drenching sweats.  Weight down 3 pounds. PERFORMANCE STATUS (ECOG):  1 HEENT:  No visual changes, runny nose, sore throat, mouth sores or tenderness. Lungs: No shortness of breath or cough.  No hemoptysis. Cardiac:  No chest pain, palpitations, orthopnea, or PND. GI:  No nausea, vomiting, diarrhea, constipation, melena or hematochezia. GU:  No urgency, frequency, dysuria, or  hematuria. Musculoskeletal:  Chronic back pain s/p fall off ladder onto concrete step.  No joint pain.  No muscle tenderness. Extremities:  No pain or swelling. Skin:  No rashes or skin changes. Neuro:  Headache (rare).  No numbness or weakness, balance or coordination issues. Endocrine:  No diabetes, thyroid issues, hot flashes or night sweats. Psych:  No mood changes, depression or anxiety. Pain:  No focal pain. Review of systems:  All other systems reviewed and found to be negative.  Physical Exam: Blood pressure  134/83, pulse 67, temperature 97.4 F (36.3 C), temperature source Tympanic, weight 188 lb 15.7 oz (85.7 kg). GENERAL:  Well developed, well nourished, gentleman slightly restless trying to find a comfortable position in the exam room with his left leg stretched out in no acute distress. MENTAL STATUS:  Alert and oriented to person, place and time. HEAD:  Black hair with goatee.  Normocephalic, atraumatic, face symmetric, no Cushingoid features. EYES:  Brown eyes.  No conjunctivitis or scleral icterus. NEUROLOGICAL: Unremarkable. PSYCH:  Appropriate.   No visits with results within 3 Day(s) from this visit.  Latest known visit with results is:  Office Visit on 06/03/2016  Component Date Value Ref Range Status  . WBC 06/03/2016 3.1* 3.8 - 10.6 K/uL Final  . RBC 06/03/2016 4.48  4.40 - 5.90 MIL/uL Final  . Hemoglobin 06/03/2016 13.3  13.0 - 18.0 g/dL Final  . HCT 06/03/2016 39.8* 40.0 - 52.0 % Final  . MCV 06/03/2016 88.9  80.0 - 100.0 fL Final  . MCH 06/03/2016 29.7  26.0 - 34.0 pg Final  . MCHC 06/03/2016 33.4  32.0 - 36.0 g/dL Final  . RDW 06/03/2016 13.8  11.5 - 14.5 % Final  . Platelets 06/03/2016 227  150 - 440 K/uL Final  . Neutrophils Relative % 06/03/2016 43%  % Final  . Neutro Abs 06/03/2016 1.3* 1.4 - 6.5 K/uL Final  . Lymphocytes Relative 06/03/2016 39%  % Final  . Lymphs Abs 06/03/2016 1.2  1.0 - 3.6 K/uL Final  . Monocytes Relative 06/03/2016 12%  % Final  . Monocytes Absolute 06/03/2016 0.4  0.2 - 1.0 K/uL Final  . Eosinophils Relative 06/03/2016 5%  % Final  . Eosinophils Absolute 06/03/2016 0.1  0 - 0.7 K/uL Final  . Basophils Relative 06/03/2016 1%  % Final  . Basophils Absolute 06/03/2016 0.0  0 - 0.1 K/uL Final  . Sodium 06/03/2016 135  135 - 145 mmol/L Final  . Potassium 06/03/2016 3.9  3.5 - 5.1 mmol/L Final  . Chloride 06/03/2016 102  101 - 111 mmol/L Final  . CO2 06/03/2016 29  22 - 32 mmol/L Final  . Glucose, Bld 06/03/2016 99  65 - 99 mg/dL Final  . BUN  06/03/2016 12  6 - 20 mg/dL Final  . Creatinine, Ser 06/03/2016 1.08  0.61 - 1.24 mg/dL Final  . Calcium 06/03/2016 8.9  8.9 - 10.3 mg/dL Final  . Total Protein 06/03/2016 8.2* 6.5 - 8.1 g/dL Final  . Albumin 06/03/2016 4.2  3.5 - 5.0 g/dL Final  . AST 06/03/2016 17  15 - 41 U/L Final  . ALT 06/03/2016 13* 17 - 63 U/L Final  . Alkaline Phosphatase 06/03/2016 63  38 - 126 U/L Final  . Total Bilirubin 06/03/2016 0.7  0.3 - 1.2 mg/dL Final  . GFR calc non Af Amer 06/03/2016 >60  >60 mL/min Final  . GFR calc Af Amer 06/03/2016 >60  >60 mL/min Final   Comment: (NOTE) The eGFR has  been calculated using the CKD EPI equation. This calculation has not been validated in all clinical situations. eGFR's persistently <60 mL/min signify possible Chronic Kidney Disease.   . Anion gap 06/03/2016 4* 5 - 15 Final  . Retic Ct Pct 06/03/2016 1.1  0.4 - 3.1 % Final  . RBC. 06/03/2016 4.37* 4.40 - 5.90 MIL/uL Final  . Retic Count, Manual 06/03/2016 48.1  19.0 - 183.0 K/uL Final  . Ferritin 06/03/2016 96  24 - 336 ng/mL Final  . Iron 06/03/2016 52  45 - 182 ug/dL Final  . TIBC 06/03/2016 276  250 - 450 ug/dL Final  . Saturation Ratios 06/03/2016 19  17.9 - 39.5 % Final  . UIBC 06/03/2016 224  ug/dL Final  . Vitamin B-12 06/04/2016 1463* 180 - 914 pg/mL Final   Comment: (NOTE) This assay is not validated for testing neonatal or myeloproliferative syndrome specimens for Vitamin B12 levels. Performed at [facility]   . Folate 06/03/2016 24.0  >5.9 ng/mL Final  . TSH 06/03/2016 3.148  0.350 - 4.500 uIU/mL Final  . Hep B Core Total Ab 06/04/2016 Negative  Negative Final   Comment: (NOTE) Performed At: [facility] [place], [place] [id number] Lindon Romp MD [id number]   . Anit Nuclear Antibody(ANA) 06/04/2016 Negative  Negative Final   Comment: (NOTE) Performed At: [facility] [place], [place] [id number] Lindon Romp MD [id number]    . Path Review 06/08/2016 Peripheral blood smear reveals mild leukopenia with neutropenia.   Final   Comment: Platelet and RBC indices and morphologies are unremarkable. Recent lab results reveals a M spike with lambda free light chains. Hematology service is following.  Reviewed by Dellia Nims Rubinas, M.D.   . Total Protein ELP 06/04/2016 7.8  6.0 - 8.5 g/dL Final  . Albumin ELP 06/04/2016 4.1  2.9 - 4.4 g/dL Final  . Alpha-1-Globulin 06/04/2016 0.2  0.0 - 0.4 g/dL Final  . Alpha-2-Globulin 06/04/2016 0.5  0.4 - 1.0 g/dL Final  . Beta Globulin 06/04/2016 1.0  0.7 - 1.3 g/dL Final  . Gamma Globulin 06/04/2016 2.0* 0.4 - 1.8 g/dL Final  . M-Spike, % 06/04/2016 1.5* Not Observed g/dL Final  . SPE Interp. 06/04/2016 Comment   Final   Comment: (NOTE) The SPE pattern demonstrates a single peak (M-spike) in the gamma region which may represent monoclonal protein. This peak may also be caused by circulating immune complexes, cryoglobulins, C-reactive protein, fibrinogen or hemolysis.  If clinically indicated, the presence of a monoclonal gammopathy may be confirmed by immuno- fixation, as well as an evaluation of the urine for the presence of Bence-Jones protein. Performed At: [facility] [place], [place] [id number] Lindon Romp MD [id number]   . Comment 06/04/2016 Comment   Final   Comment: (NOTE) Protein electrophoresis scan will follow via computer, mail, or courier delivery.   [name] GLOBULIN, TOTAL 06/04/2016 3.7  2.2 - 3.9 g/dL Corrected  . A/G Ratio 06/04/2016 1.1  0.7 - 1.7 Corrected  . Kappa free light chain 06/04/2016 15.6  3.3 - 19.4 mg/L Final  . Lamda free light chains 06/04/2016 1056.1* 5.7 - 26.3 mg/L Final  . Kappa, lamda light chain ratio 06/04/2016 0.01* 0.26 - 1.65 Final   Comment: (NOTE) Performed At: [facility] [place], [place] [id number] Lindon Romp MD [id number]:[id number]   . 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  Diet is good.  He denies any new medications or herbal products. He denies any fevers or infections.  He denies any sweats or weight loss.  He denies any alcohol or tobacco.  SPEP on 06/03/2016 revealed a 1.5 gm/dL monoclonal spike in the gamma region.  Lambda free light chains were 1,056.1 (5.7 - 26.3), kappa free light chains 15.6 with a kappa/lambda ratio of 0.01 (0.26-1.65).  Work-up on 06/03/2016 revealed a hematocrit of 39.8, hemoglobin 13.3, platelets 227,000, white count 3100 with an ANC of 1300.  Reticulocyte count was 1.1%.  Normal studies included creatinine (1.08), albumen (4.2), calcium (8.9), ferritin, iron studies, B12, folate, TSH, hepatitis B core antibody, ANA, and LDH.  Hepatitis C and HIV testing were negative on 10/02/2014.  Abdominal ultrasound on 06/09/2016 revealed a normal liver and spleen.  Colonoscopy on 04/13/2011 revealed sigmoid diverticulosis and internal hemorrhoids.  He denies any melena or hematochezia.   Symptomatically, he has chronic back pain.  He has rare fevers at night.  Weight is stable.  Exam reveals bilateral axillary fullness.  Plan: 1. Review work-up.  Discuss concern for a lymphoproliferative disorder such as myeloma or potential monoclonal gammopathy of unknown significance (MGUS).  Discuss stages of myeloma and indications for treatment.  Discuss bone survey and bone marrow aspirate and biopsy.  Discuss additional lab work.  Patient would like to perform additional lab studies prior to bone marrow biopsy.  2.  [facility] slip: immunofixation, beta-2 microglobulin.  24 hour UPEP with free light chains. 3.  Bone survey: r/o lytic lesions. 4.  RTC in 1 week for review of studies and further discussions regarding direction of therapy (bone marrow).   Lequita Asal, MD  06/17/2016, 5:31 PM

## 2016-06-21 ENCOUNTER — Encounter: Payer: Self-pay | Admitting: Hematology and Oncology

## 2016-06-23 ENCOUNTER — Encounter: Payer: Self-pay | Admitting: Hematology and Oncology

## 2016-06-24 ENCOUNTER — Encounter: Payer: Self-pay | Admitting: Hematology and Oncology

## 2016-06-24 ENCOUNTER — Inpatient Hospital Stay: Payer: Medicare Other | Attending: Hematology and Oncology | Admitting: Hematology and Oncology

## 2016-06-24 VITALS — BP 131/89 | HR 73 | Temp 96.1°F | Resp 18 | Wt 191.1 lb

## 2016-06-24 DIAGNOSIS — C9030 Solitary plasmacytoma not having achieved remission: Secondary | ICD-10-CM | POA: Diagnosis present

## 2016-06-24 DIAGNOSIS — K5730 Diverticulosis of large intestine without perforation or abscess without bleeding: Secondary | ICD-10-CM | POA: Diagnosis not present

## 2016-06-24 DIAGNOSIS — D72819 Decreased white blood cell count, unspecified: Secondary | ICD-10-CM | POA: Insufficient documentation

## 2016-06-24 DIAGNOSIS — D709 Neutropenia, unspecified: Secondary | ICD-10-CM

## 2016-06-24 DIAGNOSIS — I10 Essential (primary) hypertension: Secondary | ICD-10-CM | POA: Insufficient documentation

## 2016-06-24 DIAGNOSIS — M549 Dorsalgia, unspecified: Secondary | ICD-10-CM | POA: Diagnosis not present

## 2016-06-24 DIAGNOSIS — D649 Anemia, unspecified: Secondary | ICD-10-CM | POA: Diagnosis not present

## 2016-06-24 DIAGNOSIS — Z79899 Other long term (current) drug therapy: Secondary | ICD-10-CM | POA: Diagnosis not present

## 2016-06-24 DIAGNOSIS — D472 Monoclonal gammopathy: Secondary | ICD-10-CM | POA: Diagnosis not present

## 2016-06-24 DIAGNOSIS — Z87891 Personal history of nicotine dependence: Secondary | ICD-10-CM

## 2016-06-24 DIAGNOSIS — K648 Other hemorrhoids: Secondary | ICD-10-CM | POA: Insufficient documentation

## 2016-06-24 NOTE — Progress Notes (Signed)
[facility] day:  06/24/2016  Chief Complaint: CAMDYN BESKE is a 60 y.o. 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  HPI:  The patient was last seen in the medical oncology clinic on 06/17/2016.  At that time, work-up was reviewed. Labs were suggestive of a lymphoproliferative disorder such as myeloma or less likely monoclonal gammopathy of unknown significance (MGUS).  We discussed a bone marrow biopsy.  Decision was made to pursue additional testing prior to a bone marrow.  [facility] labs on 06/23/2016 included a beta2-microglobulin of 1.7 (0.6-2.4).  24 hour urine is unavailable.  Bone survey on 06/17/2016 revealed questionable very subtle small lucencies noted both humeri and femurs. Lesions may be related to osteopenia or tiny metastatic foci and/or multiple myeloma.  Symptomatically, he denies any new symptoms.  He has chronic back pain post a fall onto a concrete step.  History reviewed. No pertinent past medical history.  Past Surgical History:  Procedure Laterality Date  . COLONOSCOPY  04/13/2011   Procedure: COLONOSCOPY;  Surgeon: [name], MD;  Location: [facility];  Service: Endoscopy;  Laterality: N/A;    History reviewed. No pertinent family history.  Social History:  reports that he quit smoking about 47 years ago. He has never used smokeless tobacco. He reports that he does not drink alcohol. His drug history is not on file.  The patient lives in [place], [place].  The patient is accompanied by his girlfriend, Darrick Penna, today.  Allergies:  Allergies  Allergen Reactions  . Pregabalin Shortness Of Breath  . Atorvastatin Other (See Comments)    Current Medications: Current Outpatient Prescriptions  Medication Sig Dispense Refill  . gabapentin (NEURONTIN) 600 MG tablet   9  . losartan-hydrochlorothiazide (HYZAAR)  100-25 MG per tablet Take 1 tablet by mouth daily.      . methadone (DOLOPHINE) 10 MG tablet Take 10 mg by mouth every 6 (six) hours as needed. 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 nortriptyline (PAMELOR) 25 MG capsule Take 25 mg by mouth at bedtime. Take two tablets at bedtime    . amLODipine (NORVASC) 10 MG tablet Take by mouth.     No current facility-administered medications for this visit.     Review of Systems:  GENERAL:  Feels "ok".  No fevers or sweats.  Weight up 3 pounds. PERFORMANCE STATUS (ECOG):  1 HEENT:  No visual changes, runny nose, sore throat, mouth sores or tenderness. Lungs: No shortness of breath or cough.  No hemoptysis. Cardiac:  No chest pain, palpitations, orthopnea, or PND. GI:  No nausea, vomiting, diarrhea, constipation, melena or hematochezia. GU:  No urgency, frequency, dysuria, or hematuria. Musculoskeletal:  Chronic back pain s/p fall off ladder onto concrete step.  No joint pain.  No muscle tenderness. Extremities:  No pain or swelling. Skin:  No rashes or skin changes. Neuro:  No headache, numbness or weakness, balance or coordination issues. Endocrine:  No diabetes, thyroid issues, hot flashes or night sweats. Psych:  No mood changes, depression or anxiety. Pain:  No focal pain. Review of systems:  All other systems reviewed and found to be negative.  Physical Exam: Blood pressure 131/89, pulse 73, temperature (!) 96.1 F (35.6 C), temperature source Tympanic, resp. rate 18, weight 191 lb 2.2 oz (86.7 kg). GENERAL:  Well developed, well nourished, gentleman sitting in  the exam room with his left leg stretched out in no acute distress. MENTAL STATUS:  Alert and oriented to person, place and time. HEAD:  Black hair with goatee.  Normocephalic, atraumatic, face symmetric, no Cushingoid features. EYES:  Brown eyes.  No conjunctivitis or scleral icterus. NEUROLOGICAL: Unremarkable. PSYCH:  Appropriate.   No visits with results within 3  Day(s) from this visit.  Latest known visit with results is:  Office Visit on 06/03/2016  Component Date Value Ref Range Status  . WBC 06/03/2016 3.1* 3.8 - 10.6 K/uL Final  . RBC 06/03/2016 4.48  4.40 - 5.90 MIL/uL Final  . Hemoglobin 06/03/2016 13.3  13.0 - 18.0 g/dL Final  . HCT 06/03/2016 39.8* 40.0 - 52.0 % Final  . MCV 06/03/2016 88.9  80.0 - 100.0 fL Final  . MCH 06/03/2016 29.7  26.0 - 34.0 pg Final  . MCHC 06/03/2016 33.4  32.0 - 36.0 g/dL Final  . RDW 06/03/2016 13.8  11.5 - 14.5 % Final  . Platelets 06/03/2016 227  150 - 440 K/uL Final  . Neutrophils Relative % 06/03/2016 43%  % Final  . Neutro Abs 06/03/2016 1.3* 1.4 - 6.5 K/uL Final  . Lymphocytes Relative 06/03/2016 39%  % Final  . Lymphs Abs 06/03/2016 1.2  1.0 - 3.6 K/uL Final  . Monocytes Relative 06/03/2016 12%  % Final  . Monocytes Absolute 06/03/2016 0.4  0.2 - 1.0 K/uL Final  . Eosinophils Relative 06/03/2016 5%  % Final  . Eosinophils Absolute 06/03/2016 0.1  0 - 0.7 K/uL Final  . Basophils Relative 06/03/2016 1%  % Final  . Basophils Absolute 06/03/2016 0.0  0 - 0.1 K/uL Final  . Sodium 06/03/2016 135  135 - 145 mmol/L Final  . Potassium 06/03/2016 3.9  3.5 - 5.1 mmol/L Final  . Chloride 06/03/2016 102  101 - 111 mmol/L Final  . CO2 06/03/2016 29  22 - 32 mmol/L Final  . Glucose, Bld 06/03/2016 99  65 - 99 mg/dL Final  . BUN 06/03/2016 12  6 - 20 mg/dL Final  . Creatinine, Ser 06/03/2016 1.08  0.61 - 1.24 mg/dL Final  . Calcium 06/03/2016 8.9  8.9 - 10.3 mg/dL Final  . Total Protein 06/03/2016 8.2* 6.5 - 8.1 g/dL Final  . Albumin 06/03/2016 4.2  3.5 - 5.0 g/dL Final  . AST 06/03/2016 17  15 - 41 U/L Final  . ALT 06/03/2016 13* 17 - 63 U/L Final  . Alkaline Phosphatase 06/03/2016 63  38 - 126 U/L Final  . Total Bilirubin 06/03/2016 0.7  0.3 - 1.2 mg/dL Final  . GFR calc non Af Amer 06/03/2016 >60  >60 mL/min Final  . GFR calc Af Amer 06/03/2016 >60  >60 mL/min Final   Comment: (NOTE) The eGFR has been  calculated using the CKD EPI equation. This calculation has not been validated in all clinical situations. eGFR's persistently <60 mL/min signify possible Chronic Kidney Disease.   . Anion gap 06/03/2016 4* 5 - 15 Final  . Retic Ct Pct 06/03/2016 1.1  0.4 - 3.1 % Final  . RBC. 06/03/2016 4.37* 4.40 - 5.90 MIL/uL Final  . Retic Count, Manual 06/03/2016 48.1  19.0 - 183.0 K/uL Final  . Ferritin 06/03/2016 96  24 - 336 ng/mL Final  . Iron 06/03/2016 52  45 - 182 ug/dL Final  . TIBC 06/03/2016 276  250 - 450 ug/dL Final  . Saturation Ratios 06/03/2016 19  17.9 - 39.5 % Final  . UIBC 06/03/2016 224  ug/dL Final  . Vitamin B-12 06/04/2016 1463* 180 - 914 pg/mL Final   Comment: (NOTE) This assay is not validated for testing neonatal or myeloproliferative syndrome specimens for Vitamin B12 levels. Performed at [facility]   . Folate 06/03/2016 24.0  >5.9 ng/mL Final  . TSH 06/03/2016 3.148  0.350 - 4.500 uIU/mL Final  . Hep B Core Total Ab 06/04/2016 Negative  Negative Final   Comment: (NOTE) Performed At: [facility] [place], [place] [id number] Lindon Romp MD [id number]   . Anit Nuclear Antibody(ANA) 06/04/2016 Negative  Negative Final   Comment: (NOTE) Performed At: [facility] [place], [place] [id number] Lindon Romp MD OZ:[id number]   . Path Review 06/08/2016 Peripheral blood smear reveals mild leukopenia with neutropenia.   Final   Comment: Platelet and RBC indices and morphologies are unremarkable. Recent lab results reveals a M spike with lambda free light chains. Hematology service is following.  Reviewed by Dellia Nims Rubinas, M.D.   . Total Protein ELP 06/04/2016 7.8  6.0 - 8.5 g/dL Final  . Albumin ELP 06/04/2016 4.1  2.9 - 4.4 g/dL Final  . Alpha-1-Globulin 06/04/2016 0.2  0.0 - 0.4 g/dL Final  . Alpha-2-Globulin 06/04/2016 0.5  0.4 - 1.0 g/dL Final  . Beta Globulin 06/04/2016 1.0  0.7 - 1.3 g/dL Final   . Gamma Globulin 06/04/2016 2.0* 0.4 - 1.8 g/dL Final  . M-Spike, % 06/04/2016 1.5* Not Observed g/dL Final  . SPE Interp. 06/04/2016 Comment   Final   Comment: (NOTE) The SPE pattern demonstrates a single peak (M-spike) in the gamma region which may represent monoclonal protein. This peak may also be caused by circulating immune complexes, cryoglobulins, C-reactive protein, fibrinogen or hemolysis.  If clinically indicated, the presence of a monoclonal gammopathy may be confirmed by immuno- fixation, as well as an evaluation of the urine for the presence of Bence-Jones protein. Performed At: [facility] [place], [place] [id number] Lindon Romp MD [id number]:[id number]   . Comment 06/04/2016 Comment   Final   Comment: (NOTE) Protein electrophoresis scan will follow via computer, mail, or courier delivery.   [name] GLOBULIN, TOTAL 06/04/2016 3.7  2.2 - 3.9 g/dL Corrected  . A/G Ratio 06/04/2016 1.1  0.7 - 1.7 Corrected  . Kappa free light chain 06/04/2016 15.6  3.3 - 19.4 mg/L Final  . Lamda free light chains 06/04/2016 1056.1* 5.7 - 26.3 mg/L Final  . Kappa, lamda light chain ratio 06/04/2016 0.01* 0.26 - 1.65 Final   Comment: (NOTE) Performed At: [facility] [place], [place] [id number] Lindon Romp MD [id number]   . LDH 06/03/2016 137  98 - 192 U/L Final    Assessment:  REXTON GREULICH is a 59 y.o. male with a monoclonal gammopathy, normocytic anemia, and mild leukopenia.  Diet is good.  He denies any new medications or herbal products. He denies any fevers or infections.  He denies any sweats or weight loss.  He denies any alcohol or tobacco.  SPEP on 06/03/2016 revealed a 1.5 gm/dL monoclonal spike in the gamma region.  Lambda free light chains were 1,056.1 (5.7 - 26.3), kappa free light chains 15.6 with a kappa/lambda ratio of 0.01 (0.26-1.65).  Beta2-microglobulin was 1.7 (0.6-2.4) on 06/23/2016.  Work-up on 06/03/2016 revealed  a hematocrit of 39.8, hemoglobin 13.3, platelets 227,000, white count 3100 with an ANC of 1300.  Reticulocyte count was 1.1%.  Normal studies included creatinine (1.08), albumen (  4.2), calcium (8.9), ferritin, iron studies, B12, folate, TSH, hepatitis B core antibody, ANA, and LDH.  Hepatitis C and HIV testing were negative on 10/02/2014.  Abdominal ultrasound on 06/09/2016 revealed a normal liver and spleen.  Bone survey on 06/17/2016 revealed questionable very subtle small lucencies noted both humeri and femurs. Lesions may be related to osteopenia or tiny metastatic foci and/or multiple myeloma.  Colonoscopy on 04/13/2011 revealed sigmoid diverticulosis and internal hemorrhoids.  He denies any melena or hematochezia.   Symptomatically, he has chronic back pain.  He has rare fevers at night.  Exam reveals bilateral axillary fullness.  Plan: 1. Review work-up to date.  Discuss normal beta2-microglobulin.  Await 24 hour urine.  Discuss bone marrow aspirate and biopsy. 2.  Follow-up 24 hour urine for UPEP with free light chains. 3.  Schedule bone marrow aspirate and biopsy 4.  RTC 10 days after bone marrow.  Addendum:  24 hour urine on 06/21/2016 revealed 98 mg/24 hour of protein.  M-spike was 13.5% (13 mg/24 hours).  Immunofixation revealed an IgG monoclonal protein with lambda light chain specificity.   Lequita Asal, MD  06/24/2016, 10:57 AM

## 2016-06-26 ENCOUNTER — Telehealth: Payer: Self-pay | Admitting: *Deleted

## 2016-06-26 NOTE — Telephone Encounter (Signed)
Called patient to inform him that his Bone Marrow has been scheduled for 07-07-16 [facility].  Patient should arrive @ 7:30 am.  Patient will follow up with Dr. Mike Gip on 07-15-16 @ 10:00 am in the [facility] clinic. Verbalized understanding.

## 2016-07-07 ENCOUNTER — Ambulatory Visit
Admission: RE | Admit: 2016-07-07 | Discharge: 2016-07-07 | Disposition: A | Discharge status: Home / Self Care | Payer: Medicare Other | Source: Ambulatory Visit | Attending: Hematology and Oncology | Admitting: Hematology and Oncology

## 2016-07-07 ENCOUNTER — Encounter: Payer: Self-pay | Admitting: Hematology and Oncology

## 2016-07-07 DIAGNOSIS — C9000 Multiple myeloma not having achieved remission: Secondary | ICD-10-CM | POA: Insufficient documentation

## 2016-07-07 DIAGNOSIS — D709 Neutropenia, unspecified: Secondary | ICD-10-CM | POA: Insufficient documentation

## 2016-07-07 DIAGNOSIS — D649 Anemia, unspecified: Secondary | ICD-10-CM | POA: Diagnosis not present

## 2016-07-07 DIAGNOSIS — Z79899 Other long term (current) drug therapy: Secondary | ICD-10-CM | POA: Insufficient documentation

## 2016-07-07 DIAGNOSIS — D472 Monoclonal gammopathy: Secondary | ICD-10-CM

## 2016-07-07 HISTORY — DX: Anemia, unspecified: D64.9

## 2016-07-07 HISTORY — DX: Essential (primary) hypertension: I10

## 2016-07-07 LAB — DIFFERENTIAL
Basophils Absolute: 0 10*3/uL (ref 0–0.1)
Basophils Relative: 1 %
Eosinophils Absolute: 0.2 10*3/uL (ref 0–0.7)
Eosinophils Relative: 5 %
Lymphocytes Relative: 43 %
Lymphs Abs: 1.8 10*3/uL (ref 1.0–3.6)
Monocytes Absolute: 0.6 10*3/uL (ref 0.2–1.0)
Monocytes Relative: 15 %
Neutro Abs: 1.5 10*3/uL (ref 1.4–6.5)
Neutrophils Relative %: 36 %

## 2016-07-07 LAB — CBC
HCT: 36.7 % — ABNORMAL LOW (ref 40.0–52.0)
Hemoglobin: 12.8 g/dL — ABNORMAL LOW (ref 13.0–18.0)
MCH: 30.6 pg (ref 26.0–34.0)
MCHC: 34.7 g/dL (ref 32.0–36.0)
MCV: 88 fL (ref 80.0–100.0)
Platelets: 217 10*3/uL (ref 150–440)
RBC: 4.18 MIL/uL — ABNORMAL LOW (ref 4.40–5.90)
RDW: 13.9 % (ref 11.5–14.5)
WBC: 4.2 10*3/uL (ref 3.8–10.6)

## 2016-07-07 MED ORDER — HYDROCODONE-ACETAMINOPHEN 5-325 MG PO TABS
ORAL_TABLET | ORAL | Status: AC
  Filled 2016-07-07: qty 2

## 2016-07-07 MED ORDER — HYDROCODONE-ACETAMINOPHEN 5-325 MG PO TABS
1.0000 | ORAL_TABLET | ORAL | Status: DC | PRN
  Administered 2016-07-07: 2 via ORAL

## 2016-07-07 MED ORDER — MIDAZOLAM HCL 5 MG/5ML IJ SOLN
INTRAMUSCULAR | Status: AC | PRN
  Administered 2016-07-07: 0.5 mg via INTRAVENOUS
  Administered 2016-07-07: 1 mg via INTRAVENOUS
  Administered 2016-07-07: 0.5 mg via INTRAVENOUS
  Administered 2016-07-07 (×2): 1 mg via INTRAVENOUS

## 2016-07-07 MED ORDER — FENTANYL CITRATE (PF) 100 MCG/2ML IJ SOLN
INTRAMUSCULAR | Status: AC | PRN
  Administered 2016-07-07: 50 ug via INTRAVENOUS
  Administered 2016-07-07 (×2): 25 ug via INTRAVENOUS

## 2016-07-07 MED ORDER — SODIUM CHLORIDE 0.9 % IV SOLN
INTRAVENOUS | Status: DC
  Administered 2016-07-07: 09:00:00 via INTRAVENOUS
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
## 2016-07-07 NOTE — Procedures (Signed)
Interventional Radiology Procedure Note  Procedure: CT guided aspirate and core biopsy of right iliac bone Complications: None Recommendations: - Bedrest supine x 1 hr - Follow biopsy results  Maanya Hippert T. Kiandra Sanguinetti, M.D Pager:  [phone]   

## 2016-07-07 NOTE — Progress Notes (Signed)
Patient has not taken BP meds. X 2 days-to take medication when gets home.

## 2016-07-08 ENCOUNTER — Ambulatory Visit: Payer: Medicare Other | Admitting: Hematology and Oncology

## 2016-07-15 ENCOUNTER — Inpatient Hospital Stay (HOSPITAL_BASED_OUTPATIENT_CLINIC_OR_DEPARTMENT_OTHER): Payer: Medicare Other | Admitting: Hematology and Oncology

## 2016-07-15 ENCOUNTER — Encounter: Payer: Self-pay | Admitting: Hematology and Oncology

## 2016-07-15 VITALS — BP 131/76 | HR 64 | Temp 97.7°F | Resp 18 | Wt 194.7 lb

## 2016-07-15 DIAGNOSIS — D72819 Decreased white blood cell count, unspecified: Secondary | ICD-10-CM | POA: Diagnosis not present

## 2016-07-15 DIAGNOSIS — M549 Dorsalgia, unspecified: Secondary | ICD-10-CM | POA: Diagnosis not present

## 2016-07-15 DIAGNOSIS — C9000 Multiple myeloma not having achieved remission: Secondary | ICD-10-CM

## 2016-07-15 DIAGNOSIS — I10 Essential (primary) hypertension: Secondary | ICD-10-CM

## 2016-07-15 DIAGNOSIS — Z87891 Personal history of nicotine dependence: Secondary | ICD-10-CM

## 2016-07-15 DIAGNOSIS — K5730 Diverticulosis of large intestine without perforation or abscess without bleeding: Secondary | ICD-10-CM

## 2016-07-15 DIAGNOSIS — M899 Disorder of bone, unspecified: Secondary | ICD-10-CM

## 2016-07-15 DIAGNOSIS — C9030 Solitary plasmacytoma not having achieved remission: Secondary | ICD-10-CM | POA: Diagnosis not present

## 2016-07-15 DIAGNOSIS — D649 Anemia, unspecified: Secondary | ICD-10-CM

## 2016-07-15 DIAGNOSIS — Z79899 Other long term (current) drug therapy: Secondary | ICD-10-CM

## 2016-07-15 DIAGNOSIS — K648 Other hemorrhoids: Secondary | ICD-10-CM

## 2016-07-15 DIAGNOSIS — M949 Disorder of cartilage, unspecified: Principal | ICD-10-CM
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
## 2016-07-16 ENCOUNTER — Telehealth: Payer: Self-pay | Admitting: Hematology and Oncology

## 2016-07-16 NOTE — Telephone Encounter (Signed)
If pt decides to have labs here then we can order cbc with diff, met c, SPEP, and free light chains.  Ihe wants it at [facility] we can fill out paper and send to him in the mail. Will await pt to call back

## 2016-07-16 NOTE — Telephone Encounter (Signed)
Called patient to let him know that we would like to follow him in 3 months to see MD and get labs.  LVM for patient to return our call to let us know if he would like to have the labs drawn at the [facility] or if he prefers [facility]?  Instructed him to call us back to let us know where he prefers.

## 2016-07-16 NOTE — Telephone Encounter (Signed)
Pt received a call telling him he would have to pay his deductible before his PET and bone density scans. He can't afford to so he asked to cx both for now and will c/b to r/s when he gets insurance straightened out.

## 2016-07-20 ENCOUNTER — Ambulatory Visit: Admission: RE | Admit: 2016-07-20 | Payer: Medicare Other | Source: Ambulatory Visit

## 2016-07-21 NOTE — Telephone Encounter (Signed)
Called patient again today and LVM asking him to call back and let us know if he wants to come to [facility] for labs or if he wants to get them at [facility].

## 2016-07-22 ENCOUNTER — Ambulatory Visit: Payer: Medicare Other | Admitting: Hematology and Oncology

## 2016-07-27 NOTE — Telephone Encounter (Signed)
Called patient again today to ask him if he wants to have labs drawn her at [facility] or [facility].  Had to leave a voice message as patient is not answering his phone.

## 2016-10-29 DIAGNOSIS — D4989 Neoplasm of unspecified behavior of other specified sites: Secondary | ICD-10-CM | POA: Insufficient documentation

## 2016-12-08 DIAGNOSIS — D472 Monoclonal gammopathy: Secondary | ICD-10-CM | POA: Insufficient documentation

## 2016-12-08 DIAGNOSIS — C9000 Multiple myeloma not having achieved remission: Secondary | ICD-10-CM | POA: Insufficient documentation

## 2018-01-12 DIAGNOSIS — M25562 Pain in left knee: Secondary | ICD-10-CM | POA: Insufficient documentation

## 2018-06-29 DIAGNOSIS — N528 Other male erectile dysfunction: Secondary | ICD-10-CM | POA: Insufficient documentation

## 2018-06-29 DIAGNOSIS — I10 Essential (primary) hypertension: Secondary | ICD-10-CM | POA: Insufficient documentation

## 2018-10-27 ENCOUNTER — Inpatient Hospital Stay: Payer: Medicare Other | Admitting: Hematology and Oncology
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
## 2018-11-07 ENCOUNTER — Other Ambulatory Visit: Payer: Self-pay

## 2018-11-07 ENCOUNTER — Inpatient Hospital Stay: Payer: Medicare PPO | Attending: Hematology and Oncology | Admitting: Hematology and Oncology

## 2018-11-07 ENCOUNTER — Encounter: Payer: Self-pay | Admitting: Hematology and Oncology

## 2018-11-07 ENCOUNTER — Ambulatory Visit: Payer: Medicare PPO

## 2018-11-07 VITALS — BP 161/89 | HR 68 | Temp 97.9°F | Resp 16 | Ht 69.5 in | Wt 190.9 lb

## 2018-11-07 DIAGNOSIS — K648 Other hemorrhoids: Secondary | ICD-10-CM | POA: Diagnosis not present

## 2018-11-07 DIAGNOSIS — I10 Essential (primary) hypertension: Secondary | ICD-10-CM | POA: Diagnosis not present

## 2018-11-07 DIAGNOSIS — R61 Generalized hyperhidrosis: Secondary | ICD-10-CM | POA: Diagnosis not present

## 2018-11-07 DIAGNOSIS — M549 Dorsalgia, unspecified: Secondary | ICD-10-CM

## 2018-11-07 DIAGNOSIS — R51 Headache: Secondary | ICD-10-CM | POA: Diagnosis not present

## 2018-11-07 DIAGNOSIS — Z791 Long term (current) use of non-steroidal anti-inflammatories (NSAID): Secondary | ICD-10-CM | POA: Insufficient documentation

## 2018-11-07 DIAGNOSIS — M47812 Spondylosis without myelopathy or radiculopathy, cervical region: Secondary | ICD-10-CM | POA: Diagnosis not present

## 2018-11-07 DIAGNOSIS — G8929 Other chronic pain: Secondary | ICD-10-CM | POA: Diagnosis not present

## 2018-11-07 DIAGNOSIS — Z79899 Other long term (current) drug therapy: Secondary | ICD-10-CM | POA: Diagnosis not present

## 2018-11-07 DIAGNOSIS — C9000 Multiple myeloma not having achieved remission: Secondary | ICD-10-CM | POA: Diagnosis not present

## 2018-11-07 DIAGNOSIS — K5730 Diverticulosis of large intestine without perforation or abscess without bleeding: Secondary | ICD-10-CM | POA: Diagnosis not present

## 2018-11-07 DIAGNOSIS — D472 Monoclonal gammopathy: Secondary | ICD-10-CM

## 2018-11-07 DIAGNOSIS — Z9181 History of falling: Secondary | ICD-10-CM

## 2018-11-07 LAB — COMPREHENSIVE METABOLIC PANEL
ALK PHOS: 75 U/L (ref 38–126)
ALT: 11 U/L (ref 0–44)
AST: 20 U/L (ref 15–41)
Albumin: 4 g/dL (ref 3.5–5.0)
Anion gap: 8 (ref 5–15)
BUN: 11 mg/dL (ref 8–23)
CALCIUM: 8.8 mg/dL — AB (ref 8.9–10.3)
CHLORIDE: 105 mmol/L (ref 98–111)
CO2: 28 mmol/L (ref 22–32)
CREATININE: 0.92 mg/dL (ref 0.61–1.24)
GFR calc Af Amer: >60 mL/min (ref >60)
Glucose, Bld: 94 mg/dL (ref 70–99)
Potassium: 3.6 mmol/L (ref 3.5–5.1)
Sodium: 141 mmol/L (ref 135–145)
Total Bilirubin: 1 mg/dL (ref 0.3–1.2)
Total Protein: 8 g/dL (ref 6.5–8.1)

## 2018-11-07 LAB — CBC WITH DIFFERENTIAL/PLATELET
Abs Immature Granulocytes: 0.01 10*3/uL (ref 0.00–0.07)
Basophils Absolute: 0 10*3/uL (ref 0.0–0.1)
Basophils Relative: 1 %
EOS ABS: 0.2 10*3/uL (ref 0.0–0.5)
EOS PCT: 5 %
HEMATOCRIT: 40.5 % (ref 39.0–52.0)
HEMOGLOBIN: 13.3 g/dL (ref 13.0–17.0)
Immature Granulocytes: 0 %
LYMPHS ABS: 1.2 10*3/uL (ref 0.7–4.0)
LYMPHS PCT: 40 %
MCH: 30 pg (ref 26.0–34.0)
MCHC: 32.8 g/dL (ref 30.0–36.0)
MCV: 91.4 fL (ref 80.0–100.0)
MONO ABS: 0.4 10*3/uL (ref 0.1–1.0)
Monocytes Relative: 14 %
Neutro Abs: 1.2 10*3/uL — ABNORMAL LOW (ref 1.7–7.7)
Neutrophils Relative %: 40 %
Platelets: 196 10*3/uL (ref 150–400)
RBC: 4.43 MIL/uL (ref 4.22–5.81)
RDW: 12.9 % (ref 11.5–15.5)
WBC: 2.9 10*3/uL — ABNORMAL LOW (ref 4.0–10.5)
nRBC: 0 % (ref 0.0–0.2)

## 2018-11-07 LAB — LACTATE DEHYDROGENASE: LDH: 117 U/L (ref 98–192)

## 2018-11-07 NOTE — Progress Notes (Signed)
Pt back after receiving second opinion from Dr. Christel Mormon for MM. Denies any concerns at this time.

## 2018-11-08 LAB — KAPPA/LAMBDA LIGHT CHAINS
Kappa free light chain: 17.8 mg/L (ref 3.3–19.4)
Kappa, lambda light chain ratio: 0.02 — ABNORMAL LOW (ref 0.26–1.65)
Lambda free light chains: 882.9 mg/L — ABNORMAL HIGH (ref 5.7–26.3)

## 2018-11-08 LAB — BETA 2 MICROGLOBULIN, SERUM: Beta-2 Microglobulin: 2 mg/L (ref 0.6–2.4)

## 2018-11-09 LAB — MULTIPLE MYELOMA PANEL, SERUM
ALBUMIN/GLOB SERPL: 1.2 (ref 0.7–1.7)
ALPHA2 GLOB SERPL ELPH-MCNC: 0.5 g/dL (ref 0.4–1.0)
Albumin SerPl Elph-Mcnc: 4.1 g/dL (ref 2.9–4.4)
Alpha 1: 0.2 g/dL (ref 0.0–0.4)
B-GLOBULIN SERPL ELPH-MCNC: 0.9 g/dL (ref 0.7–1.3)
GLOBULIN, TOTAL: 3.5 g/dL (ref 2.2–3.9)
Gamma Glob SerPl Elph-Mcnc: 1.9 g/dL — ABNORMAL HIGH (ref 0.4–1.8)
IGA: 78 mg/dL (ref 61–437)
IgG (Immunoglobin G), Serum: 2039 mg/dL — ABNORMAL HIGH (ref 700–1600)
IgM (Immunoglobulin M), Srm: 93 mg/dL (ref 20–172)
M Protein SerPl Elph-Mcnc: 1.4 g/dL — ABNORMAL HIGH
Total Protein ELP: 7.6 g/dL (ref 6.0–8.5)

## 2018-11-13 ENCOUNTER — Encounter: Payer: Self-pay | Admitting: Hematology and Oncology

## 2019-02-07 ENCOUNTER — Inpatient Hospital Stay: Payer: Medicare PPO

## 2019-02-08 ENCOUNTER — Other Ambulatory Visit: Payer: Medicare PPO

## 2019-02-08 ENCOUNTER — Inpatient Hospital Stay: Payer: Medicare PPO | Admitting: Hematology and Oncology
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
## 2019-03-07 ENCOUNTER — Inpatient Hospital Stay: Payer: Medicare PPO | Attending: Hematology and Oncology

## 2019-03-07 ENCOUNTER — Ambulatory Visit
Admission: RE | Admit: 2019-03-07 | Discharge: 2019-03-07 | Disposition: A | Discharge status: Home / Self Care | Payer: Medicare PPO | Attending: Hematology and Oncology | Admitting: Hematology and Oncology

## 2019-03-07 ENCOUNTER — Inpatient Hospital Stay (HOSPITAL_BASED_OUTPATIENT_CLINIC_OR_DEPARTMENT_OTHER): Payer: Medicare PPO | Admitting: Hematology and Oncology

## 2019-03-07 ENCOUNTER — Ambulatory Visit
Admission: RE | Admit: 2019-03-07 | Discharge: 2019-03-07 | Disposition: A | Discharge status: Home / Self Care | Payer: Medicare PPO | Source: Ambulatory Visit | Attending: Hematology and Oncology | Admitting: Hematology and Oncology

## 2019-03-07 ENCOUNTER — Other Ambulatory Visit: Payer: Self-pay

## 2019-03-07 ENCOUNTER — Encounter: Payer: Self-pay | Admitting: Hematology and Oncology

## 2019-03-07 VITALS — BP 154/87 | HR 68 | Temp 98.3°F | Resp 18 | Ht 69.5 in | Wt 183.0 lb

## 2019-03-07 DIAGNOSIS — M549 Dorsalgia, unspecified: Secondary | ICD-10-CM | POA: Insufficient documentation

## 2019-03-07 DIAGNOSIS — R61 Generalized hyperhidrosis: Secondary | ICD-10-CM | POA: Diagnosis not present

## 2019-03-07 DIAGNOSIS — R634 Abnormal weight loss: Secondary | ICD-10-CM | POA: Insufficient documentation

## 2019-03-07 DIAGNOSIS — Z79899 Other long term (current) drug therapy: Secondary | ICD-10-CM

## 2019-03-07 DIAGNOSIS — R51 Headache: Secondary | ICD-10-CM | POA: Diagnosis not present

## 2019-03-07 DIAGNOSIS — D649 Anemia, unspecified: Secondary | ICD-10-CM | POA: Diagnosis not present

## 2019-03-07 DIAGNOSIS — I10 Essential (primary) hypertension: Secondary | ICD-10-CM | POA: Insufficient documentation

## 2019-03-07 DIAGNOSIS — Z791 Long term (current) use of non-steroidal anti-inflammatories (NSAID): Secondary | ICD-10-CM

## 2019-03-07 DIAGNOSIS — C9000 Multiple myeloma not having achieved remission: Secondary | ICD-10-CM

## 2019-03-07 DIAGNOSIS — Z9181 History of falling: Secondary | ICD-10-CM | POA: Diagnosis not present

## 2019-03-07 DIAGNOSIS — G8929 Other chronic pain: Secondary | ICD-10-CM | POA: Insufficient documentation

## 2019-03-07 DIAGNOSIS — M25561 Pain in right knee: Secondary | ICD-10-CM

## 2019-03-07 DIAGNOSIS — D472 Monoclonal gammopathy: Secondary | ICD-10-CM

## 2019-03-07 LAB — COMPREHENSIVE METABOLIC PANEL
ALT: 11 U/L (ref 0–44)
AST: 17 U/L (ref 15–41)
Albumin: 4 g/dL (ref 3.5–5.0)
Alkaline Phosphatase: 69 U/L (ref 38–126)
Anion gap: 9 (ref 5–15)
BUN: 16 mg/dL (ref 8–23)
CO2: 24 mmol/L (ref 22–32)
Calcium: 8.9 mg/dL (ref 8.9–10.3)
Chloride: 100 mmol/L (ref 98–111)
Creatinine, Ser: 1.01 mg/dL (ref 0.61–1.24)
GFR calc Af Amer: >60 mL/min (ref >60)
GFR calc non Af Amer: >60 mL/min (ref >60)
Glucose, Bld: 107 mg/dL — ABNORMAL HIGH (ref 70–99)
Potassium: 3.7 mmol/L (ref 3.5–5.1)
Sodium: 133 mmol/L — ABNORMAL LOW (ref 135–145)
Total Bilirubin: 1.1 mg/dL (ref 0.3–1.2)
Total Protein: 8.1 g/dL (ref 6.5–8.1)

## 2019-03-07 LAB — CBC WITH DIFFERENTIAL/PLATELET
Abs Immature Granulocytes: 0.01 10*3/uL (ref 0.00–0.07)
Basophils Absolute: 0 10*3/uL (ref 0.0–0.1)
Basophils Relative: 1 %
Eosinophils Absolute: 0.2 10*3/uL (ref 0.0–0.5)
Eosinophils Relative: 5 %
HCT: 37.6 % — ABNORMAL LOW (ref 39.0–52.0)
Hemoglobin: 12.7 g/dL — ABNORMAL LOW (ref 13.0–17.0)
Immature Granulocytes: 0 %
Lymphocytes Relative: 46 %
Lymphs Abs: 1.9 10*3/uL (ref 0.7–4.0)
MCH: 30.5 pg (ref 26.0–34.0)
MCHC: 33.8 g/dL (ref 30.0–36.0)
MCV: 90.2 fL (ref 80.0–100.0)
Monocytes Absolute: 0.5 10*3/uL (ref 0.1–1.0)
Monocytes Relative: 12 %
Neutro Abs: 1.5 10*3/uL — ABNORMAL LOW (ref 1.7–7.7)
Neutrophils Relative %: 36 %
Platelets: 229 10*3/uL (ref 150–400)
RBC: 4.17 MIL/uL — ABNORMAL LOW (ref 4.22–5.81)
RDW: 13.2 % (ref 11.5–15.5)
WBC: 4.1 10*3/uL (ref 4.0–10.5)
nRBC: 0 % (ref 0.0–0.2)

## 2019-03-07 LAB — RETICULOCYTES
Immature Retic Fract: 7.1 % (ref 2.3–15.9)
RBC.: 4.19 MIL/uL — ABNORMAL LOW (ref 4.22–5.81)
Retic Count, Absolute: 58.7 10*3/uL (ref 19.0–186.0)
Retic Ct Pct: 1.4 % (ref 0.4–3.1)

## 2019-03-07 LAB — IRON AND TIBC
Iron: 87 ug/dL (ref 45–182)
Saturation Ratios: 30 % (ref 17.9–39.5)
TIBC: 286 ug/dL (ref 250–450)
UIBC: 199 ug/dL

## 2019-03-07 LAB — FERRITIN: Ferritin: 77 ng/mL (ref 24–336)

## 2019-03-07 LAB — VITAMIN B12: Vitamin B-12: 1089 pg/mL — ABNORMAL HIGH (ref 180–914)

## 2019-03-07 LAB — FOLATE: Folate: 28 ng/mL (ref >5.9)

## 2019-03-07 NOTE — Progress Notes (Signed)
Patient reports low back pain ( pain level today 7) it is a ongoing problem

## 2019-03-08 LAB — KAPPA/LAMBDA LIGHT CHAINS
Kappa free light chain: 21 mg/L — ABNORMAL HIGH (ref 3.3–19.4)
Kappa, lambda light chain ratio: 0.02 — ABNORMAL LOW (ref 0.26–1.65)
Lambda free light chains: 1054.1 mg/L — ABNORMAL HIGH (ref 5.7–26.3)

## 2019-03-10 LAB — MULTIPLE MYELOMA PANEL, SERUM
Albumin SerPl Elph-Mcnc: 3.6 g/dL (ref 2.9–4.4)
Albumin/Glob SerPl: 1 (ref 0.7–1.7)
Alpha 1: 0.2 g/dL (ref 0.0–0.4)
Alpha2 Glob SerPl Elph-Mcnc: 0.6 g/dL (ref 0.4–1.0)
B-Globulin SerPl Elph-Mcnc: 0.9 g/dL (ref 0.7–1.3)
Gamma Glob SerPl Elph-Mcnc: 2 g/dL — ABNORMAL HIGH (ref 0.4–1.8)
Globulin, Total: 3.7 g/dL (ref 2.2–3.9)
IgA: 83 mg/dL (ref 61–437)
IgG (Immunoglobin G), Serum: 2072 mg/dL — ABNORMAL HIGH (ref 603–1613)
IgM (Immunoglobulin M), Srm: 89 mg/dL (ref 20–172)
M Protein SerPl Elph-Mcnc: 1.3 g/dL — ABNORMAL HIGH
Total Protein ELP: 7.3 g/dL (ref 6.0–8.5)

## 2019-04-04 ENCOUNTER — Other Ambulatory Visit: Payer: Medicare PPO

## 2019-04-04 ENCOUNTER — Ambulatory Visit: Payer: Medicare PPO | Admitting: Hematology and Oncology
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
## 2019-04-19 ENCOUNTER — Inpatient Hospital Stay (HOSPITAL_BASED_OUTPATIENT_CLINIC_OR_DEPARTMENT_OTHER): Payer: Medicare PPO | Admitting: Hematology and Oncology

## 2019-04-19 ENCOUNTER — Encounter: Payer: Self-pay | Admitting: Hematology and Oncology

## 2019-04-19 ENCOUNTER — Inpatient Hospital Stay: Payer: Medicare PPO | Attending: Hematology and Oncology

## 2019-04-19 ENCOUNTER — Other Ambulatory Visit: Payer: Self-pay

## 2019-04-19 VITALS — BP 149/92 | HR 56 | Temp 98.1°F | Resp 16 | Wt 186.1 lb

## 2019-04-19 DIAGNOSIS — D649 Anemia, unspecified: Secondary | ICD-10-CM | POA: Insufficient documentation

## 2019-04-19 DIAGNOSIS — I10 Essential (primary) hypertension: Secondary | ICD-10-CM | POA: Insufficient documentation

## 2019-04-19 DIAGNOSIS — C9000 Multiple myeloma not having achieved remission: Secondary | ICD-10-CM | POA: Diagnosis not present

## 2019-04-19 DIAGNOSIS — Z79899 Other long term (current) drug therapy: Secondary | ICD-10-CM | POA: Diagnosis not present

## 2019-04-19 LAB — CBC WITH DIFFERENTIAL/PLATELET
Abs Immature Granulocytes: 0 10*3/uL (ref 0.00–0.07)
Basophils Absolute: 0 10*3/uL (ref 0.0–0.1)
Basophils Relative: 1 %
Eosinophils Absolute: 0.2 10*3/uL (ref 0.0–0.5)
Eosinophils Relative: 4 %
HCT: 35.4 % — ABNORMAL LOW (ref 39.0–52.0)
Hemoglobin: 11.9 g/dL — ABNORMAL LOW (ref 13.0–17.0)
Immature Granulocytes: 0 %
Lymphocytes Relative: 38 %
Lymphs Abs: 1.3 10*3/uL (ref 0.7–4.0)
MCH: 30.8 pg (ref 26.0–34.0)
MCHC: 33.6 g/dL (ref 30.0–36.0)
MCV: 91.7 fL (ref 80.0–100.0)
Monocytes Absolute: 0.4 10*3/uL (ref 0.1–1.0)
Monocytes Relative: 10 %
Neutro Abs: 1.6 10*3/uL — ABNORMAL LOW (ref 1.7–7.7)
Neutrophils Relative %: 47 %
Platelets: 189 10*3/uL (ref 150–400)
RBC: 3.86 MIL/uL — ABNORMAL LOW (ref 4.22–5.81)
RDW: 12.5 % (ref 11.5–15.5)
WBC: 3.5 10*3/uL — ABNORMAL LOW (ref 4.0–10.5)
nRBC: 0 % (ref 0.0–0.2)

## 2019-04-19 LAB — BASIC METABOLIC PANEL
Anion gap: 9 (ref 5–15)
BUN: 13 mg/dL (ref 8–23)
CO2: 26 mmol/L (ref 22–32)
Calcium: 8.6 mg/dL — ABNORMAL LOW (ref 8.9–10.3)
Chloride: 101 mmol/L (ref 98–111)
Creatinine, Ser: 0.94 mg/dL (ref 0.61–1.24)
GFR calc Af Amer: >60 mL/min (ref >60)
GFR calc non Af Amer: >60 mL/min (ref >60)
Glucose, Bld: 128 mg/dL — ABNORMAL HIGH (ref 70–99)
Potassium: 3.8 mmol/L (ref 3.5–5.1)
Sodium: 136 mmol/L (ref 135–145)

## 2019-04-19 LAB — TSH: TSH: 2.519 u[IU]/mL (ref 0.350–4.500)

## 2019-04-19 LAB — LACTATE DEHYDROGENASE: LDH: 117 U/L (ref 98–192)

## 2019-04-19 NOTE — Progress Notes (Signed)
Patient here for follow up. Denies any concerns.  
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
## 2019-06-14 ENCOUNTER — Encounter: Payer: Self-pay | Admitting: Hematology and Oncology

## 2019-06-14 ENCOUNTER — Inpatient Hospital Stay: Payer: Medicare PPO | Attending: Hematology and Oncology | Admitting: Hematology and Oncology

## 2019-06-14 ENCOUNTER — Inpatient Hospital Stay: Payer: Medicare PPO

## 2019-06-14 ENCOUNTER — Other Ambulatory Visit: Payer: Self-pay

## 2019-06-14 VITALS — BP 145/87 | HR 71 | Temp 97.8°F | Resp 18 | Wt 184.6 lb

## 2019-06-14 DIAGNOSIS — M545 Low back pain: Secondary | ICD-10-CM | POA: Diagnosis not present

## 2019-06-14 DIAGNOSIS — C9000 Multiple myeloma not having achieved remission: Secondary | ICD-10-CM | POA: Diagnosis not present

## 2019-06-14 DIAGNOSIS — I10 Essential (primary) hypertension: Secondary | ICD-10-CM | POA: Diagnosis not present

## 2019-06-14 DIAGNOSIS — M549 Dorsalgia, unspecified: Secondary | ICD-10-CM

## 2019-06-14 DIAGNOSIS — Z79899 Other long term (current) drug therapy: Secondary | ICD-10-CM | POA: Diagnosis not present

## 2019-06-14 DIAGNOSIS — D649 Anemia, unspecified: Secondary | ICD-10-CM | POA: Diagnosis not present

## 2019-06-14 DIAGNOSIS — G8929 Other chronic pain: Secondary | ICD-10-CM

## 2019-06-14 LAB — COMPREHENSIVE METABOLIC PANEL
ALT: 11 U/L (ref 0–44)
AST: 18 U/L (ref 15–41)
Albumin: 4 g/dL (ref 3.5–5.0)
Alkaline Phosphatase: 74 U/L (ref 38–126)
Anion gap: 9 (ref 5–15)
BUN: 12 mg/dL (ref 8–23)
CO2: 25 mmol/L (ref 22–32)
Calcium: 8.7 mg/dL — ABNORMAL LOW (ref 8.9–10.3)
Chloride: 104 mmol/L (ref 98–111)
Creatinine, Ser: 1.03 mg/dL (ref 0.61–1.24)
GFR calc Af Amer: >60 mL/min (ref >60)
GFR calc non Af Amer: >60 mL/min (ref >60)
Glucose, Bld: 92 mg/dL (ref 70–99)
Potassium: 3.8 mmol/L (ref 3.5–5.1)
Sodium: 138 mmol/L (ref 135–145)
Total Bilirubin: 0.5 mg/dL (ref 0.3–1.2)
Total Protein: 7.6 g/dL (ref 6.5–8.1)

## 2019-06-14 LAB — CBC WITH DIFFERENTIAL/PLATELET
Abs Immature Granulocytes: 0 10*3/uL (ref 0.00–0.07)
Basophils Absolute: 0 10*3/uL (ref 0.0–0.1)
Basophils Relative: 1 %
Eosinophils Absolute: 0.2 10*3/uL (ref 0.0–0.5)
Eosinophils Relative: 6 %
HCT: 36.2 % — ABNORMAL LOW (ref 39.0–52.0)
Hemoglobin: 12.1 g/dL — ABNORMAL LOW (ref 13.0–17.0)
Immature Granulocytes: 0 %
Lymphocytes Relative: 48 %
Lymphs Abs: 1.6 10*3/uL (ref 0.7–4.0)
MCH: 30 pg (ref 26.0–34.0)
MCHC: 33.4 g/dL (ref 30.0–36.0)
MCV: 89.6 fL (ref 80.0–100.0)
Monocytes Absolute: 0.5 10*3/uL (ref 0.1–1.0)
Monocytes Relative: 15 %
Neutro Abs: 1 10*3/uL — ABNORMAL LOW (ref 1.7–7.7)
Neutrophils Relative %: 30 %
Platelets: 222 10*3/uL (ref 150–400)
RBC: 4.04 MIL/uL — ABNORMAL LOW (ref 4.22–5.81)
RDW: 13.2 % (ref 11.5–15.5)
WBC: 3.5 10*3/uL — ABNORMAL LOW (ref 4.0–10.5)
nRBC: 0 % (ref 0.0–0.2)

## 2019-06-14 NOTE — Progress Notes (Signed)
Patient here for follow up. Denies any concerns at this time.  

## 2019-06-17 LAB — PROTEIN ELECTROPHORESIS, SERUM
A/G Ratio: 1.1 (ref 0.7–1.7)
Albumin ELP: 3.9 g/dL (ref 2.9–4.4)
Alpha-1-Globulin: 0.2 g/dL (ref 0.0–0.4)
Alpha-2-Globulin: 0.7 g/dL (ref 0.4–1.0)
Beta Globulin: 0.7 g/dL (ref 0.7–1.3)
Gamma Globulin: 1.8 g/dL (ref 0.4–1.8)
Globulin, Total: 3.4 g/dL (ref 2.2–3.9)
M-Spike, %: 1.3 g/dL — ABNORMAL HIGH
Total Protein ELP: 7.3 g/dL (ref 6.0–8.5)

## 2019-06-21 DIAGNOSIS — G8929 Other chronic pain: Secondary | ICD-10-CM | POA: Insufficient documentation

## 2019-08-14 ENCOUNTER — Inpatient Hospital Stay: Payer: Medicare PPO | Attending: Hematology and Oncology

## 2019-08-15 ENCOUNTER — Telehealth: Payer: Self-pay

## 2019-08-15 ENCOUNTER — Inpatient Hospital Stay: Payer: Medicare PPO | Admitting: Hematology and Oncology

## 2019-08-15 NOTE — Telephone Encounter (Signed)
Left a message to inform the patient.  I tried to reach out to the patient to do his assesment and didn't get a answer / i was able to leave a message to inform the patient to contact the office at his earlies convience we have him schedule for a doximity visit today.
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
## 2019-08-24 ENCOUNTER — Inpatient Hospital Stay: Payer: Medicare PPO | Attending: Hematology and Oncology | Admitting: Hematology and Oncology

## 2019-08-24 DIAGNOSIS — C9000 Multiple myeloma not having achieved remission: Secondary | ICD-10-CM

## 2019-08-24 DIAGNOSIS — D649 Anemia, unspecified: Secondary | ICD-10-CM

## 2020-07-16 IMAGING — CR METASTATIC BONE SURVEY
9 of 10 series · 9 of 10 positions shown · non-contrast
Comparison: 06/17/2016

CLINICAL DATA: Multiple myeloma with medial left knee pain 3 months
and lower back pain since 1888.

EXAM:
METASTATIC BONE SURVEY

[chest pa]
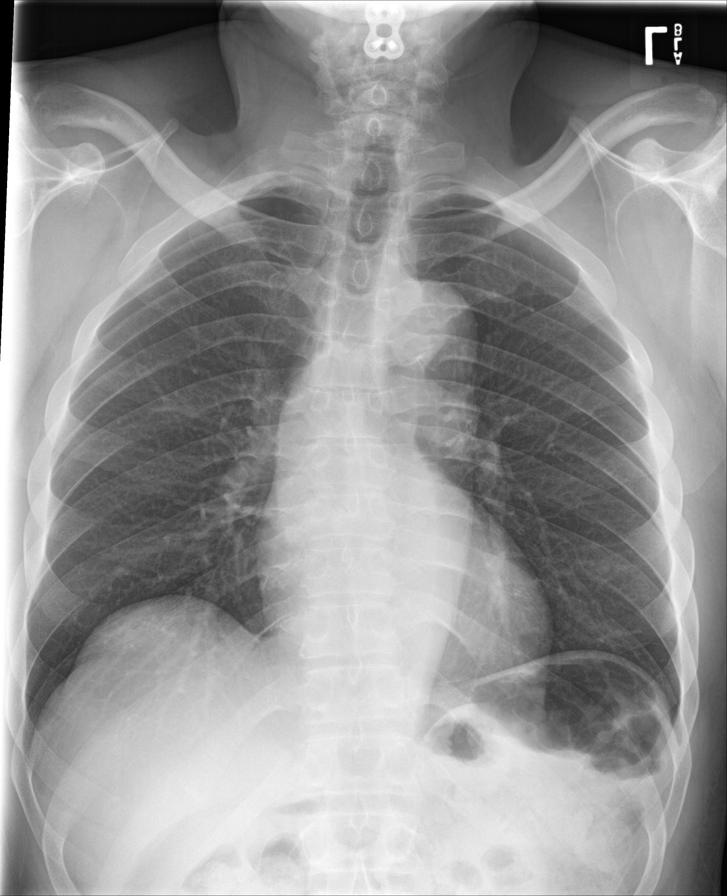

[skull lat]
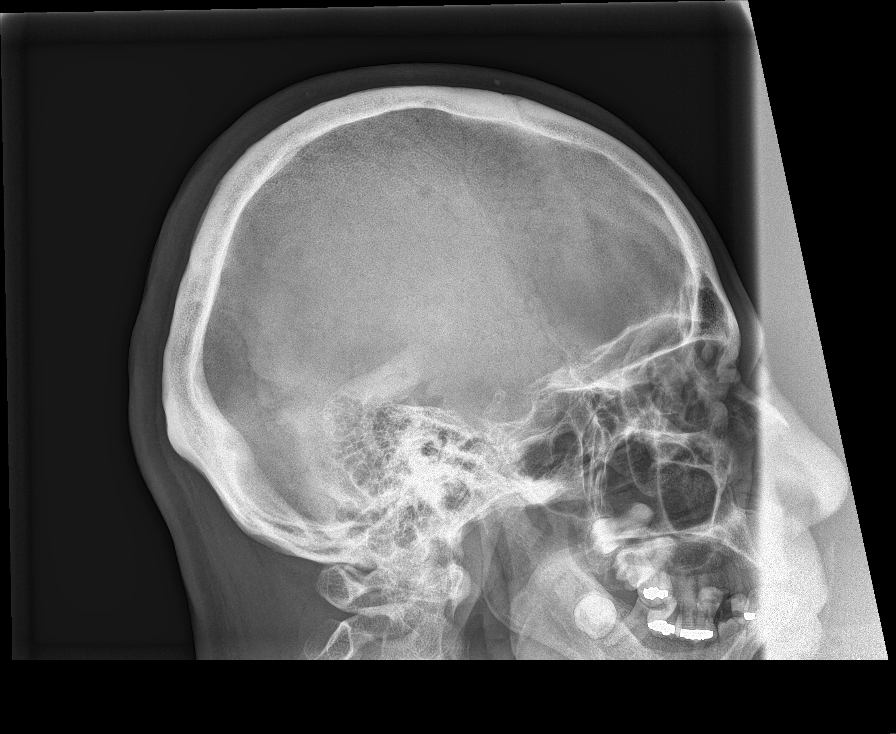

[c-spine lat]
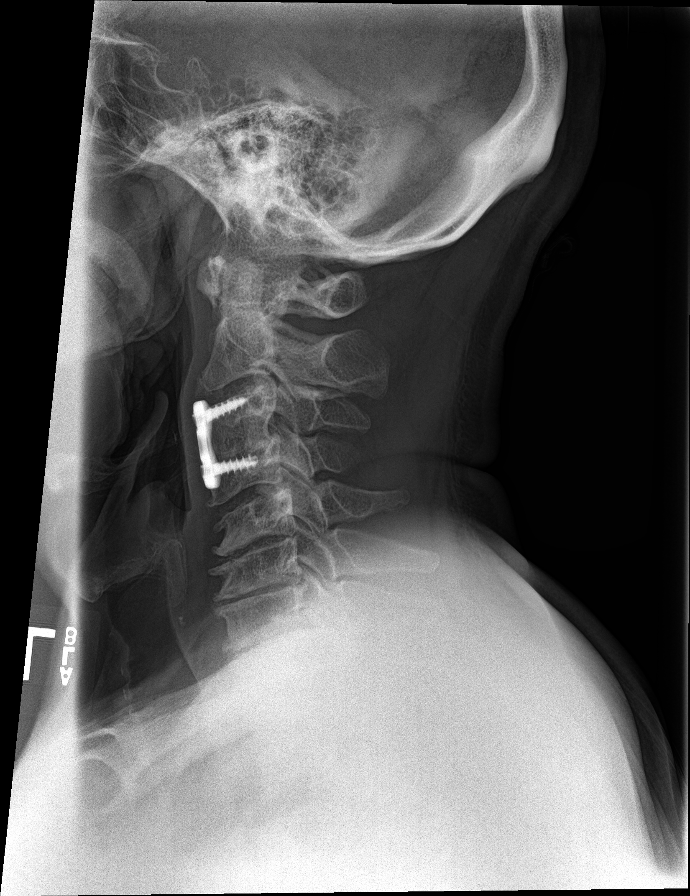

[c-spine ap]
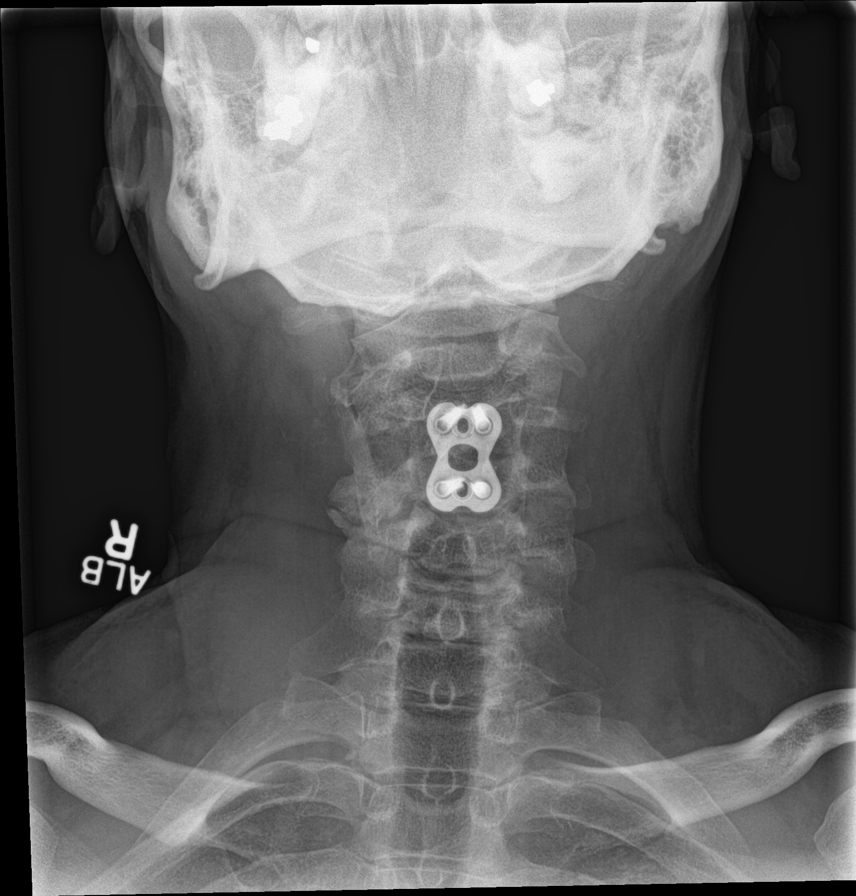

[shoulder ap (1 of 2)]
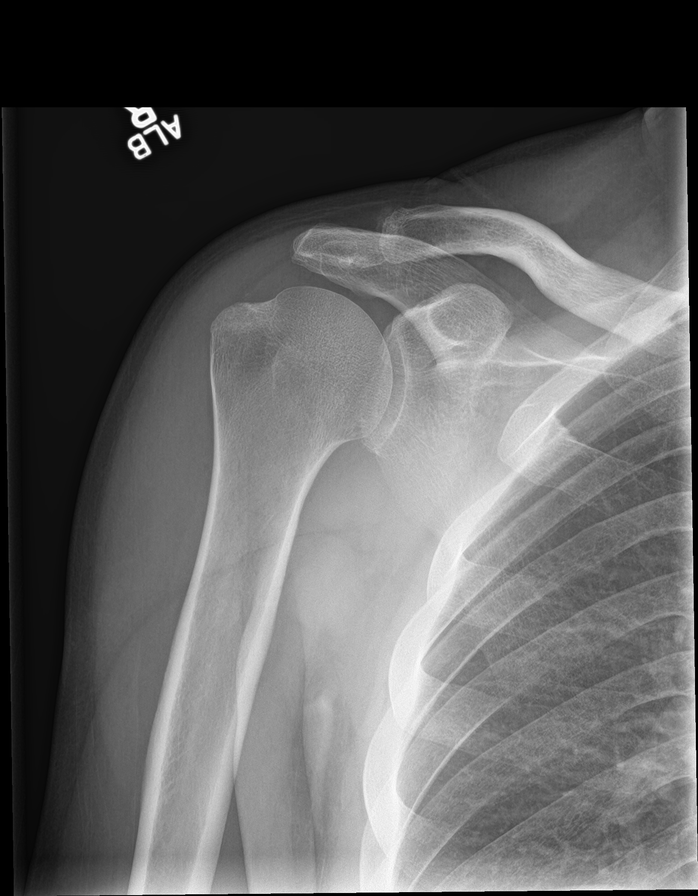

[shoulder ap (2 of 2)]
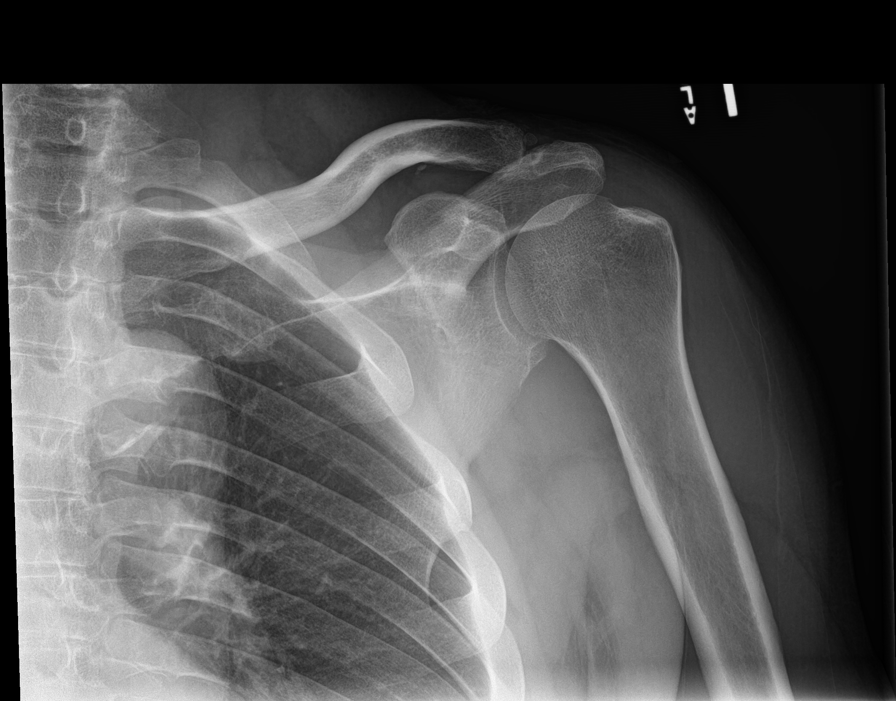

[humerus ap (1 of 2)]
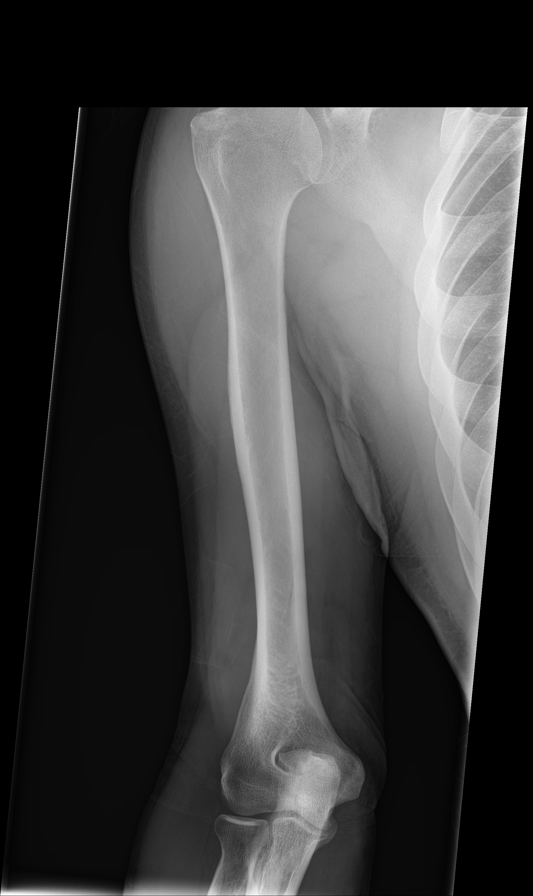

[humerus ap (2 of 2)]
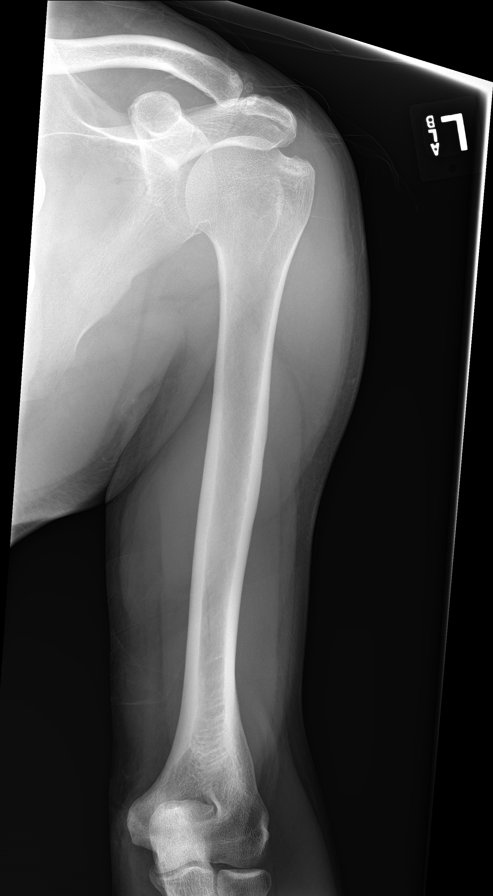

[forearm ap]
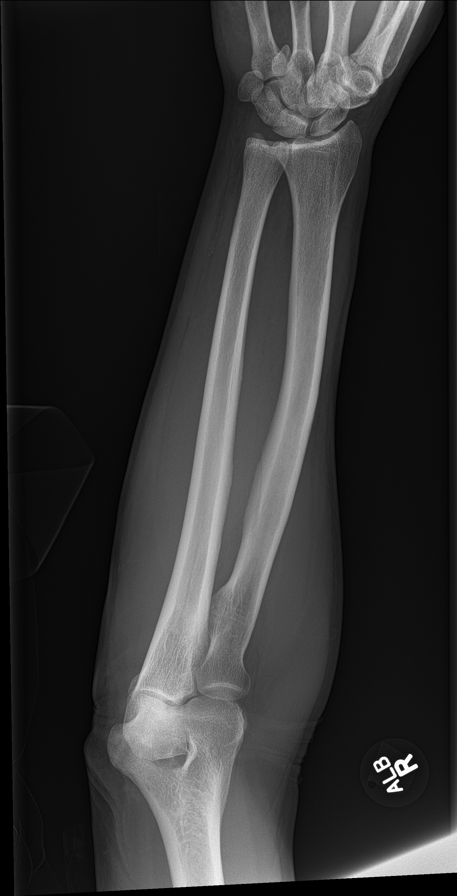

[9 of 10 positions shown; findings below may reference images not displayed]

FINDINGS: 5 mm lucency over the parietal skull not well seen previously. Few
tiny subcentimeter lucencies over the proximal femurs seen best on
the AP pelvis view and less apparent on the cone-down views of the
proximal femurs as these are unchanged. Subtle lucencies previously
seen over the distal humeri not well visualized on today's exam.

Anterior fusion hardware at the C3-4 level intact and unchanged.
Degenerative changes of the cervical spine to include multilevel
disc disease. Mild degenerate change of the sacroiliac joints and
minimal degenerative change of the lumbar spine. Mild symmetric
degenerative change of the hips. Mild degenerative change of the
knees.
IMPRESSION: 5 mm lucency over the parietal skull which is new. Subtle stable
tiny lucencies over the proximal femurs bilaterally. Previously seen
lucency over the distal humeri not visualized on today's exam.

## 2020-12-19 ENCOUNTER — Other Ambulatory Visit: Payer: Self-pay

## 2020-12-19 DIAGNOSIS — C9000 Multiple myeloma not having achieved remission: Secondary | ICD-10-CM
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
## 2020-12-21 ENCOUNTER — Other Ambulatory Visit: Payer: Self-pay | Admitting: Hematology and Oncology

## 2020-12-21 DIAGNOSIS — C9000 Multiple myeloma not having achieved remission: Secondary | ICD-10-CM

## 2020-12-23 ENCOUNTER — Other Ambulatory Visit: Payer: Self-pay

## 2020-12-23 ENCOUNTER — Inpatient Hospital Stay: Payer: Medicare PPO | Attending: Hematology and Oncology | Admitting: Hematology and Oncology

## 2020-12-23 ENCOUNTER — Encounter: Payer: Self-pay | Admitting: Hematology and Oncology

## 2020-12-23 ENCOUNTER — Inpatient Hospital Stay: Payer: Medicare PPO

## 2020-12-23 VITALS — BP 131/83 | HR 65 | Temp 98.5°F | Resp 20 | Wt 186.9 lb

## 2020-12-23 DIAGNOSIS — M549 Dorsalgia, unspecified: Secondary | ICD-10-CM

## 2020-12-23 DIAGNOSIS — C9000 Multiple myeloma not having achieved remission: Secondary | ICD-10-CM

## 2020-12-23 DIAGNOSIS — G8929 Other chronic pain: Secondary | ICD-10-CM

## 2020-12-23 DIAGNOSIS — D649 Anemia, unspecified: Secondary | ICD-10-CM | POA: Diagnosis not present

## 2020-12-23 LAB — CBC WITH DIFFERENTIAL/PLATELET
Abs Immature Granulocytes: 0.01 10*3/uL (ref 0.00–0.07)
Basophils Absolute: 0 10*3/uL (ref 0.0–0.1)
Basophils Relative: 1 %
Eosinophils Absolute: 0.2 10*3/uL (ref 0.0–0.5)
Eosinophils Relative: 4 %
HCT: 39.8 % (ref 39.0–52.0)
Hemoglobin: 13.5 g/dL (ref 13.0–17.0)
Immature Granulocytes: 0 %
Lymphocytes Relative: 41 %
Lymphs Abs: 1.4 10*3/uL (ref 0.7–4.0)
MCH: 29.7 pg (ref 26.0–34.0)
MCHC: 33.9 g/dL (ref 30.0–36.0)
MCV: 87.7 fL (ref 80.0–100.0)
Monocytes Absolute: 0.4 10*3/uL (ref 0.1–1.0)
Monocytes Relative: 10 %
Neutro Abs: 1.5 10*3/uL — ABNORMAL LOW (ref 1.7–7.7)
Neutrophils Relative %: 44 %
Platelets: 260 10*3/uL (ref 150–400)
RBC: 4.54 MIL/uL (ref 4.22–5.81)
RDW: 13.2 % (ref 11.5–15.5)
WBC: 3.5 10*3/uL — ABNORMAL LOW (ref 4.0–10.5)
nRBC: 0 % (ref 0.0–0.2)

## 2020-12-23 LAB — COMPREHENSIVE METABOLIC PANEL
ALT: 12 U/L (ref 0–44)
AST: 18 U/L (ref 15–41)
Albumin: 4.1 g/dL (ref 3.5–5.0)
Alkaline Phosphatase: 75 U/L (ref 38–126)
Anion gap: 6 (ref 5–15)
BUN: 16 mg/dL (ref 8–23)
CO2: 27 mmol/L (ref 22–32)
Calcium: 9 mg/dL (ref 8.9–10.3)
Chloride: 101 mmol/L (ref 98–111)
Creatinine, Ser: 1 mg/dL (ref 0.61–1.24)
GFR, Estimated: >60 mL/min (ref >60)
Glucose, Bld: 116 mg/dL — ABNORMAL HIGH (ref 70–99)
Potassium: 3.4 mmol/L — ABNORMAL LOW (ref 3.5–5.1)
Sodium: 134 mmol/L — ABNORMAL LOW (ref 135–145)
Total Bilirubin: 1.1 mg/dL (ref 0.3–1.2)
Total Protein: 8.1 g/dL (ref 6.5–8.1)

## 2020-12-24 LAB — KAPPA/LAMBDA LIGHT CHAINS
Kappa free light chain: 20.5 mg/L — ABNORMAL HIGH (ref 3.3–19.4)
Kappa, lambda light chain ratio: 0.02 — ABNORMAL LOW (ref 0.26–1.65)
Lambda free light chains: 1095.4 mg/L — ABNORMAL HIGH (ref 5.7–26.3)

## 2020-12-26 LAB — PROTEIN ELECTROPHORESIS, SERUM
A/G Ratio: 1 (ref 0.7–1.7)
Albumin ELP: 3.7 g/dL (ref 2.9–4.4)
Alpha-1-Globulin: 0.2 g/dL (ref 0.0–0.4)
Alpha-2-Globulin: 0.6 g/dL (ref 0.4–1.0)
Beta Globulin: 1 g/dL (ref 0.7–1.3)
Gamma Globulin: 2 g/dL — ABNORMAL HIGH (ref 0.4–1.8)
Globulin, Total: 3.8 g/dL (ref 2.2–3.9)
M-Spike, %: 1.6 g/dL — ABNORMAL HIGH
Total Protein ELP: 7.5 g/dL (ref 6.0–8.5)

## 2020-12-27 ENCOUNTER — Telehealth: Payer: Self-pay | Admitting: Hematology and Oncology

## 2020-12-27 NOTE — Telephone Encounter (Signed)
12/27/2020 Left VM for pt informing him that appt on 4/14 has been r/s to 4/22 due to provider's schedule. Gave callback number in case there are any questions or scheduling concerns SRW

## 2021-01-02 ENCOUNTER — Telehealth: Payer: Medicare PPO | Admitting: Hematology and Oncology
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
## 2021-01-10 ENCOUNTER — Inpatient Hospital Stay: Payer: Medicare PPO | Admitting: Oncology

## 2021-01-10 ENCOUNTER — Other Ambulatory Visit: Payer: Self-pay

## 2021-01-15 ENCOUNTER — Telehealth: Payer: Self-pay | Admitting: Oncology

## 2021-01-15 NOTE — Telephone Encounter (Signed)
I called patient to set up virtual visit with L Allen week of May 2nd. Per Lorretta Harp. I have left 2 messages. Patient has not returned my call.

## 2021-01-16 ENCOUNTER — Inpatient Hospital Stay (HOSPITAL_BASED_OUTPATIENT_CLINIC_OR_DEPARTMENT_OTHER): Payer: Medicare PPO | Admitting: Nurse Practitioner

## 2021-01-16 ENCOUNTER — Other Ambulatory Visit: Payer: Self-pay

## 2021-01-16 DIAGNOSIS — C9000 Multiple myeloma not having achieved remission: Secondary | ICD-10-CM

## 2021-01-16 DIAGNOSIS — D472 Monoclonal gammopathy: Secondary | ICD-10-CM
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
## 2021-01-17 ENCOUNTER — Telehealth: Payer: Self-pay

## 2021-01-17 ENCOUNTER — Encounter: Payer: Self-pay | Admitting: Nurse Practitioner

## 2021-02-12 ENCOUNTER — Telehealth: Payer: Self-pay | Admitting: Oncology

## 2021-02-12 DIAGNOSIS — D472 Monoclonal gammopathy: Secondary | ICD-10-CM

## 2021-02-12 DIAGNOSIS — C9000 Multiple myeloma not having achieved remission: Secondary | ICD-10-CM

## 2021-02-12 NOTE — Telephone Encounter (Signed)
Patient's wife called and stated that with Dr. Mike Gip leaving, patient would like to transfer care to location closer to home-and see Dr. Delton Coombes in [place] (currently scheduled to see Dr. Tasia Catchings 03/27/21) Patient's wife called their office and they stated that a referral was needed.

## 2021-02-12 NOTE — Telephone Encounter (Signed)
OK to send referral

## 2021-02-13 NOTE — Telephone Encounter (Signed)
Message forwarded to Scheryl Darter, NP

## 2021-02-19 NOTE — Telephone Encounter (Signed)
Yes, that's fine. Nira Conn could you send referral?

## 2021-02-20 NOTE — Telephone Encounter (Signed)
Referral has been entered in [place].

## 2021-02-20 NOTE — Addendum Note (Signed)
Addended by: Vanice Sarah on: 02/20/2021 03:48 PM   Modules accepted: Orders

## 2021-02-20 NOTE — Telephone Encounter (Signed)
Sure. Thanks 

## 2021-03-12 ENCOUNTER — Other Ambulatory Visit: Payer: Self-pay

## 2021-03-12 ENCOUNTER — Ambulatory Visit (HOSPITAL_COMMUNITY)
Admission: RE | Admit: 2021-03-12 | Discharge: 2021-03-12 | Disposition: A | Discharge status: Home / Self Care | Payer: Medicare PPO | Source: Ambulatory Visit | Attending: Hematology and Oncology | Admitting: Hematology and Oncology

## 2021-03-12 ENCOUNTER — Encounter (HOSPITAL_COMMUNITY): Payer: Self-pay | Admitting: Hematology and Oncology

## 2021-03-12 ENCOUNTER — Inpatient Hospital Stay (HOSPITAL_COMMUNITY): Payer: Medicare PPO | Attending: Hematology and Oncology

## 2021-03-12 ENCOUNTER — Inpatient Hospital Stay (HOSPITAL_COMMUNITY): Payer: Medicare PPO | Attending: Hematology and Oncology | Admitting: Hematology and Oncology

## 2021-03-12 VITALS — BP 139/87 | HR 64 | Temp 96.9°F | Resp 18 | Ht 69.0 in | Wt 187.4 lb

## 2021-03-12 DIAGNOSIS — Z79899 Other long term (current) drug therapy: Secondary | ICD-10-CM | POA: Insufficient documentation

## 2021-03-12 DIAGNOSIS — I10 Essential (primary) hypertension: Secondary | ICD-10-CM | POA: Insufficient documentation

## 2021-03-12 DIAGNOSIS — D61818 Other pancytopenia: Secondary | ICD-10-CM | POA: Diagnosis not present

## 2021-03-12 DIAGNOSIS — E559 Vitamin D deficiency, unspecified: Secondary | ICD-10-CM | POA: Insufficient documentation

## 2021-03-12 DIAGNOSIS — C9000 Multiple myeloma not having achieved remission: Secondary | ICD-10-CM | POA: Insufficient documentation

## 2021-03-12 DIAGNOSIS — G8929 Other chronic pain: Secondary | ICD-10-CM

## 2021-03-12 DIAGNOSIS — M5442 Lumbago with sciatica, left side: Secondary | ICD-10-CM

## 2021-03-12 LAB — COMPREHENSIVE METABOLIC PANEL
ALT: 13 U/L (ref 0–44)
AST: 18 U/L (ref 15–41)
Albumin: 4 g/dL (ref 3.5–5.0)
Alkaline Phosphatase: 76 U/L (ref 38–126)
Anion gap: 7 (ref 5–15)
BUN: 16 mg/dL (ref 8–23)
CO2: 27 mmol/L (ref 22–32)
Calcium: 9 mg/dL (ref 8.9–10.3)
Chloride: 103 mmol/L (ref 98–111)
Creatinine, Ser: 0.94 mg/dL (ref 0.61–1.24)
GFR, Estimated: >60 mL/min (ref >60)
Glucose, Bld: 100 mg/dL — ABNORMAL HIGH (ref 70–99)
Potassium: 4.2 mmol/L (ref 3.5–5.1)
Sodium: 137 mmol/L (ref 135–145)
Total Bilirubin: 0.8 mg/dL (ref 0.3–1.2)
Total Protein: 7.7 g/dL (ref 6.5–8.1)

## 2021-03-12 LAB — CBC WITH DIFFERENTIAL/PLATELET
Abs Immature Granulocytes: 0 10*3/uL (ref 0.00–0.07)
Basophils Absolute: 0 10*3/uL (ref 0.0–0.1)
Basophils Relative: 1 %
Eosinophils Absolute: 0.2 10*3/uL (ref 0.0–0.5)
Eosinophils Relative: 5 %
HCT: 38.9 % — ABNORMAL LOW (ref 39.0–52.0)
Hemoglobin: 12.8 g/dL — ABNORMAL LOW (ref 13.0–17.0)
Immature Granulocytes: 0 %
Lymphocytes Relative: 46 %
Lymphs Abs: 1.6 10*3/uL (ref 0.7–4.0)
MCH: 30.3 pg (ref 26.0–34.0)
MCHC: 32.9 g/dL (ref 30.0–36.0)
MCV: 92 fL (ref 80.0–100.0)
Monocytes Absolute: 0.5 10*3/uL (ref 0.1–1.0)
Monocytes Relative: 15 %
Neutro Abs: 1.2 10*3/uL — ABNORMAL LOW (ref 1.7–7.7)
Neutrophils Relative %: 33 %
Platelets: 243 10*3/uL (ref 150–400)
RBC: 4.23 MIL/uL (ref 4.22–5.81)
RDW: 13.2 % (ref 11.5–15.5)
WBC: 3.6 10*3/uL — ABNORMAL LOW (ref 4.0–10.5)
nRBC: 0 % (ref 0.0–0.2)

## 2021-03-12 LAB — VITAMIN D 25 HYDROXY (VIT D DEFICIENCY, FRACTURES): Vit D, 25-Hydroxy: 23.84 ng/mL — ABNORMAL LOW (ref 30–100)

## 2021-03-12 LAB — URIC ACID: Uric Acid, Serum: 3.9 mg/dL (ref 3.7–8.6)

## 2021-03-12 NOTE — Assessment & Plan Note (Signed)
I am wondering whether the patient might have vitamin D deficiency exacerbating his chronic pain I will order vitamin D level to be checked
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
## 2021-03-12 NOTE — Assessment & Plan Note (Signed)
He has severe chronic back pain for the last 10 years and received pain management through a specialized pain medicine specialist He is waiting for MRI to be performed I told him systematic approach to this I will order a skeletal survey today with plan for further imaging study next week if needed In the meantime, he will continue chronic pain management through his pain medicine specialist

## 2021-03-12 NOTE — Assessment & Plan Note (Addendum)
The patient is seen here because his primary oncologist has left the practice I am concerned about his significant light chain levels He has not had repeat imaging study done to his back and is still waiting for his pain medicine specialist to order MRI I recommend we proceed with blood work and skeletal survey with plan for further imaging study and possible repeat bone marrow biopsy next week if his myeloma panel show disease progression Unfortunately, I will not be here next week He will establish care with Dr. Delton Coombes next week  I discussed the natural history of multiple myeloma The patient have at least smoldering myeloma based on his last work-up However, his serum light chain is really high and I am concerned that he might be progressing to full-blown multiple myeloma I explained to the patient and his wife the rationale of performing all these tests and he is in agreement to proceed

## 2021-03-12 NOTE — Assessment & Plan Note (Signed)
He has very mild pancytopenia He will benefit from repeat bone marrow aspirate and biopsy in the future if needed to assess

## 2021-03-13 LAB — KAPPA/LAMBDA LIGHT CHAINS
Kappa free light chain: 17.9 mg/L (ref 3.3–19.4)
Kappa, lambda light chain ratio: 0.02 — ABNORMAL LOW (ref 0.26–1.65)
Lambda free light chains: 989 mg/L — ABNORMAL HIGH (ref 5.7–26.3)

## 2021-03-13 LAB — BETA 2 MICROGLOBULIN, SERUM: Beta-2 Microglobulin: 1.8 mg/L (ref 0.6–2.4)

## 2021-03-16 LAB — MULTIPLE MYELOMA PANEL, SERUM
Albumin SerPl Elph-Mcnc: 3.9 g/dL (ref 2.9–4.4)
Albumin/Glob SerPl: 1.2 (ref 0.7–1.7)
Alpha 1: 0.2 g/dL (ref 0.0–0.4)
Alpha2 Glob SerPl Elph-Mcnc: 0.6 g/dL (ref 0.4–1.0)
B-Globulin SerPl Elph-Mcnc: 0.8 g/dL (ref 0.7–1.3)
Gamma Glob SerPl Elph-Mcnc: 1.9 g/dL — ABNORMAL HIGH (ref 0.4–1.8)
Globulin, Total: 3.5 g/dL (ref 2.2–3.9)
IgA: 88 mg/dL (ref 61–437)
IgG (Immunoglobin G), Serum: 1991 mg/dL — ABNORMAL HIGH (ref 603–1613)
IgM (Immunoglobulin M), Srm: 91 mg/dL (ref 20–172)
M Protein SerPl Elph-Mcnc: 1.5 g/dL — ABNORMAL HIGH
Total Protein ELP: 7.4 g/dL (ref 6.0–8.5)

## 2021-03-26 ENCOUNTER — Encounter (HOSPITAL_COMMUNITY): Payer: Self-pay | Admitting: Hematology and Oncology

## 2021-03-26 ENCOUNTER — Other Ambulatory Visit (HOSPITAL_COMMUNITY): Payer: Self-pay

## 2021-03-26 ENCOUNTER — Other Ambulatory Visit: Payer: Self-pay

## 2021-03-26 ENCOUNTER — Inpatient Hospital Stay (HOSPITAL_COMMUNITY): Payer: Medicare PPO | Attending: Hematology and Oncology | Admitting: Hematology and Oncology

## 2021-03-26 VITALS — BP 129/70 | HR 70 | Temp 97.3°F | Resp 18 | Wt 189.4 lb

## 2021-03-26 DIAGNOSIS — D61818 Other pancytopenia: Secondary | ICD-10-CM | POA: Insufficient documentation

## 2021-03-26 DIAGNOSIS — Z79899 Other long term (current) drug therapy: Secondary | ICD-10-CM | POA: Insufficient documentation

## 2021-03-26 DIAGNOSIS — M5442 Lumbago with sciatica, left side: Secondary | ICD-10-CM | POA: Diagnosis not present

## 2021-03-26 DIAGNOSIS — G8929 Other chronic pain: Secondary | ICD-10-CM

## 2021-03-26 DIAGNOSIS — C9000 Multiple myeloma not having achieved remission: Secondary | ICD-10-CM

## 2021-03-26 DIAGNOSIS — D472 Monoclonal gammopathy: Secondary | ICD-10-CM

## 2021-03-26 DIAGNOSIS — E559 Vitamin D deficiency, unspecified: Secondary | ICD-10-CM | POA: Diagnosis not present

## 2021-03-26 NOTE — Progress Notes (Signed)
[facility] OFFICE PROGRESS NOTE  Patient Care Team: Gayland Curry, MD as PCP - General (Family Medicine) Birdie Hopes, FNP (Anesthesiology) Birdie Hopes, FNP (Anesthesiology) Brien Mates, RN as Oncology Nurse Navigator (Oncology)  ASSESSMENT & PLAN:  Smoldering myeloma [facility]) Overall, I am not convinced he had lytic lesions on his imaging studies He has no changes of progression on his M protein or light chain levels He has no signs of kidney damage, hypercalcemia, anemia and bone damage His chronic back pain is not related I felt that he is still at the smoldering myeloma stage I recommend we focus on supportive care and return in 3 months for further follow-up  Vitamin D deficiency He has severe vitamin D deficiency I recommend high-dose over-the-counter vitamin D replacement therapy We will recheck his vitamin D level again in his next visit  Pancytopenia, acquired [facility]) He has mild intermittent chronic pancytopenia but not symptomatic Observe for now  Chronic midline low back pain with left-sided sciatica He has severe chronic back pain for the last 10 years and received pain management through a specialized pain medicine specialist He is waiting for MRI to be performed In the meantime, he will continue chronic pain management through his pain medicine specialist  Orders Placed This Encounter  Procedures   CBC with Differential/Platelet    Standing Status:   Future    Standing Expiration Date:   03/26/2022   Comprehensive metabolic panel    Standing Status:   Future    Standing Expiration Date:   03/26/2022   VITAMIN D 25 Hydroxy (Vit-D Deficiency, Fractures)    Standing Status:   Future    Standing Expiration Date:   03/26/2022   Kappa/lambda light chains    Standing Status:   Future    Standing Expiration Date:   03/26/2022   Multiple Myeloma Panel (SPEP&IFE w/QIG)    Standing Status:   Future    Standing Expiration Date:    03/26/2022   Beta 2 microglobulin, serum    Standing Status:   Future    Standing Expiration Date:   03/26/2022    All questions were answered. The patient knows to call the clinic with any problems, questions or concerns. The total time spent in the appointment was 20 minutes encounter with patients including review of chart and various tests results, discussions about plan of care and coordination of care plan   Juan Lark, MD 03/26/2021 12:13 PM  INTERVAL HISTORY: Please see below for problem oriented charting. He returns to review test results His chronic back pain is stable He denies recent infection, fever or chills  SUMMARY OF ONCOLOGIC HISTORY: Juan Wall 65 y.o. male was transferred to my care after his prior physician has left.  I reviewed the patient's records extensive and collaborated the history with the patient. Summary of his history is as follows: The patient carries diagnosis of smoldering myeloma He had bone marrow aspirate and biopsy performed in the past; in 2017, 10 to 15% bone marrow was involved (please see scanned report) He is being observed because he does not fulfill full criteria for multiple myeloma and is labeled as smoldering myeloma His only symptoms is severe back pain which has been present for 10 years  REVIEW OF SYSTEMS:   Constitutional: Denies fevers, chills or abnormal weight loss Eyes: Denies blurriness of vision Ears, nose, mouth, throat, and face: Denies mucositis or sore throat Respiratory: Denies cough, dyspnea or wheezes Cardiovascular: Denies palpitation, chest  discomfort or lower extremity swelling Gastrointestinal:  Denies nausea, heartburn or change in bowel habits Skin: Denies abnormal skin rashes Lymphatics: Denies new lymphadenopathy or easy bruising Neurological:Denies numbness, tingling or new weaknesses Behavioral/Psych: Mood is stable, no new changes  All other systems were reviewed with the patient and are negative.  I  have reviewed the past medical history, past surgical history, social history and family history with the patient and they are unchanged from previous note.  ALLERGIES:  is allergic to nortriptyline, pregabalin, rosuvastatin, and atorvastatin.  MEDICATIONS:  Current Outpatient Medications  Medication Sig Dispense Refill   amLODipine (NORVASC) 10 MG tablet Take 10 mg by mouth daily.     gabapentin (NEURONTIN) 600 MG tablet Take 600 mg by mouth 2 (two) times daily.   9   losartan (COZAAR) 100 MG tablet Take 100 mg by mouth daily.      methadone (DOLOPHINE) 10 MG tablet Take 10 mg by mouth every 6 (six) hours as needed. 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     sildenafil (REVATIO) 20 MG tablet TAKE 3 TO 5 TABLETS BY MOUTH PRIOR TO RELATIONS     tiZANidine (ZANAFLEX) 4 MG tablet Take 1 or 2 tablets at night as needed.     acetaminophen (TYLENOL) 650 MG CR tablet Take 650 mg by mouth every 8 (eight) hours as needed for pain. (Patient not taking: Reported on 03/26/2021)     No current facility-administered medications for this visit.    PHYSICAL EXAMINATION: ECOG PERFORMANCE STATUS: 1 - Symptomatic but completely ambulatory  Vitals:   03/26/21 1102  BP: 129/70  Pulse: 70  Resp: 18  Temp: (!) 97.3 F (36.3 C)  SpO2: 100%   Filed Weights   03/26/21 1102  Weight: 189 lb 6.4 oz (85.9 kg)    GENERAL:alert, no distress and comfortable Musculoskeletal:no cyanosis of digits and no clubbing  NEURO: alert & oriented x 3 with fluent speech, no focal motor/sensory deficits  LABORATORY DATA:  I have reviewed the data as listed    Component Value Date/Time   NA 137 03/12/2021 1503   K 4.2 03/12/2021 1503   CL 103 03/12/2021 1503   CO2 27 03/12/2021 1503   GLUCOSE 100 (H) 03/12/2021 1503   BUN 16 03/12/2021 1503   CREATININE 0.94 03/12/2021 1503   CALCIUM 9.0 03/12/2021 1503   PROT 7.7 03/12/2021 1503   ALBUMIN 4.0  03/12/2021 1503   AST 18 03/12/2021 1503   ALT 13 03/12/2021 1503   ALKPHOS 76 03/12/2021 1503   BILITOT 0.8 03/12/2021 1503   GFRNONAA >60 03/12/2021 1503   GFRAA >60 06/14/2019 1421    No results found for: SPEP, UPEP  Lab Results  Component Value Date   WBC 3.6 (L) 03/12/2021   NEUTROABS 1.2 (L) 03/12/2021   HGB 12.8 (L) 03/12/2021   HCT 38.9 (L) 03/12/2021   MCV 92.0 03/12/2021   PLT 243 03/12/2021      Chemistry      Component Value Date/Time   NA 137 03/12/2021 1503   K 4.2 03/12/2021 1503   CL 103 03/12/2021 1503   CO2 27 03/12/2021 1503   BUN 16 03/12/2021 1503   CREATININE 0.94 03/12/2021 1503      Component Value Date/Time   CALCIUM 9.0 03/12/2021 1503   ALKPHOS 76 03/12/2021 1503   AST 18 03/12/2021 1503   ALT 13 03/12/2021 1503  BILITOT 0.8 03/12/2021 1503       RADIOGRAPHIC STUDIES: I have personally reviewed the radiological images as listed and agreed with the findings in the report. DG Bone Survey Met  Result Date: 03/12/2021 CLINICAL DATA:  65 year old male with multiple myeloma staging. EXAM: METASTATIC BONE SURVEY COMPARISON:  Skeletal survey dated 03/07/2019. FINDINGS: A subcentimeter faint lucency over the parietal skull appears similar to prior radiograph. Apparent 2 additional faint ill-defined small lucencies in the skull are suboptimally evaluated and may be artifactual or represent new lesions. No other suspicious bone lesion identified. There is degenerative changes of the cervical spine and knees. C3-C4 ACDF. There is a 6 mm radiopaque focus over the left renal silhouette most consistent with a kidney stone. IMPRESSION: 1. Artifact versus possible new small lucencies in the skull. No other suspicious bone lesion. 2. Left renal calculus. 3. Degenerative changes of the cervical spine and knees. Electronically Signed   By: Anner Crete M.D.   On: 03/12/2021 15:15

## 2021-03-26 NOTE — Assessment & Plan Note (Signed)
Overall, I am not convinced he had lytic lesions on his imaging studies He has no changes of progression on his M protein or light chain levels He has no signs of kidney damage, hypercalcemia, anemia and bone damage His chronic back pain is not related I felt that he is still at the smoldering myeloma stage I recommend we focus on supportive care and return in 3 months for further follow-up

## 2021-03-26 NOTE — Assessment & Plan Note (Signed)
He has severe vitamin D deficiency I recommend high-dose over-the-counter vitamin D replacement therapy We will recheck his vitamin D level again in his next visit

## 2021-03-26 NOTE — Assessment & Plan Note (Signed)
He has mild intermittent chronic pancytopenia but not symptomatic Observe for now

## 2021-03-26 NOTE — Assessment & Plan Note (Signed)
He has severe chronic back pain for the last 10 years and received pain management through a specialized pain medicine specialist He is waiting for MRI to be performed In the meantime, he will continue chronic pain management through his pain medicine specialist

## 2021-03-27 ENCOUNTER — Ambulatory Visit: Payer: Medicare PPO | Admitting: Oncology

## 2021-03-27 ENCOUNTER — Other Ambulatory Visit: Payer: Medicare PPO

## 2021-06-18 ENCOUNTER — Other Ambulatory Visit: Payer: Self-pay

## 2021-06-18 ENCOUNTER — Inpatient Hospital Stay (HOSPITAL_COMMUNITY): Payer: Medicare Other | Attending: Hematology

## 2021-06-18 DIAGNOSIS — C9000 Multiple myeloma not having achieved remission: Secondary | ICD-10-CM | POA: Insufficient documentation

## 2021-06-18 DIAGNOSIS — E559 Vitamin D deficiency, unspecified: Secondary | ICD-10-CM | POA: Insufficient documentation

## 2021-06-18 DIAGNOSIS — D472 Monoclonal gammopathy: Secondary | ICD-10-CM

## 2021-06-18 LAB — CBC WITH DIFFERENTIAL/PLATELET
Abs Immature Granulocytes: 0.01 10*3/uL (ref 0.00–0.07)
Basophils Absolute: 0.1 10*3/uL (ref 0.0–0.1)
Basophils Relative: 1 %
Eosinophils Absolute: 0.3 10*3/uL (ref 0.0–0.5)
Eosinophils Relative: 8 %
HCT: 37.7 % — ABNORMAL LOW (ref 39.0–52.0)
Hemoglobin: 12.4 g/dL — ABNORMAL LOW (ref 13.0–17.0)
Immature Granulocytes: 0 %
Lymphocytes Relative: 48 %
Lymphs Abs: 2 10*3/uL (ref 0.7–4.0)
MCH: 30.7 pg (ref 26.0–34.0)
MCHC: 32.9 g/dL (ref 30.0–36.0)
MCV: 93.3 fL (ref 80.0–100.0)
Monocytes Absolute: 0.5 10*3/uL (ref 0.1–1.0)
Monocytes Relative: 11 %
Neutro Abs: 1.3 10*3/uL — ABNORMAL LOW (ref 1.7–7.7)
Neutrophils Relative %: 32 %
Platelets: 248 10*3/uL (ref 150–400)
RBC: 4.04 MIL/uL — ABNORMAL LOW (ref 4.22–5.81)
RDW: 12.9 % (ref 11.5–15.5)
WBC: 4.2 10*3/uL (ref 4.0–10.5)
nRBC: 0 % (ref 0.0–0.2)

## 2021-06-18 LAB — COMPREHENSIVE METABOLIC PANEL
ALT: 11 U/L (ref 0–44)
AST: 14 U/L — ABNORMAL LOW (ref 15–41)
Albumin: 3.6 g/dL (ref 3.5–5.0)
Alkaline Phosphatase: 79 U/L (ref 38–126)
Anion gap: 5 (ref 5–15)
BUN: 14 mg/dL (ref 8–23)
CO2: 29 mmol/L (ref 22–32)
Calcium: 9 mg/dL (ref 8.9–10.3)
Chloride: 102 mmol/L (ref 98–111)
Creatinine, Ser: 0.89 mg/dL (ref 0.61–1.24)
GFR, Estimated: >60 mL/min (ref >60)
Glucose, Bld: 110 mg/dL — ABNORMAL HIGH (ref 70–99)
Potassium: 4.1 mmol/L (ref 3.5–5.1)
Sodium: 136 mmol/L (ref 135–145)
Total Bilirubin: 0.5 mg/dL (ref 0.3–1.2)
Total Protein: 7.5 g/dL (ref 6.5–8.1)

## 2021-06-18 LAB — VITAMIN D 25 HYDROXY (VIT D DEFICIENCY, FRACTURES): Vit D, 25-Hydroxy: 29.2 ng/mL — ABNORMAL LOW (ref 30–100)

## 2021-06-19 LAB — KAPPA/LAMBDA LIGHT CHAINS
Kappa free light chain: 22.8 mg/L — ABNORMAL HIGH (ref 3.3–19.4)
Kappa, lambda light chain ratio: 0.02 — ABNORMAL LOW (ref 0.26–1.65)
Lambda free light chains: 995.7 mg/L — ABNORMAL HIGH (ref 5.7–26.3)

## 2021-06-19 LAB — BETA 2 MICROGLOBULIN, SERUM: Beta-2 Microglobulin: 1.6 mg/L (ref 0.6–2.4)

## 2021-06-20 LAB — PROTEIN ELECTROPHORESIS, SERUM
A/G Ratio: 1 (ref 0.7–1.7)
Albumin ELP: 3.6 g/dL (ref 2.9–4.4)
Alpha-1-Globulin: 0.2 g/dL (ref 0.0–0.4)
Alpha-2-Globulin: 0.6 g/dL (ref 0.4–1.0)
Beta Globulin: 0.9 g/dL (ref 0.7–1.3)
Gamma Globulin: 2 g/dL — ABNORMAL HIGH (ref 0.4–1.8)
Globulin, Total: 3.7 g/dL (ref 2.2–3.9)
M-Spike, %: 1.4 g/dL — ABNORMAL HIGH
Total Protein ELP: 7.3 g/dL (ref 6.0–8.5)

## 2021-06-23 LAB — IMMUNOFIXATION ELECTROPHORESIS
IgA: 88 mg/dL (ref 61–437)
IgG (Immunoglobin G), Serum: 2045 mg/dL — ABNORMAL HIGH (ref 603–1613)
IgM (Immunoglobulin M), Srm: 108 mg/dL (ref 20–172)
Total Protein ELP: 6.9 g/dL (ref 6.0–8.5)

## 2021-06-25 ENCOUNTER — Ambulatory Visit (HOSPITAL_COMMUNITY): Payer: Medicare PPO | Admitting: Hematology
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
## 2021-07-17 ENCOUNTER — Ambulatory Visit (HOSPITAL_COMMUNITY): Payer: Medicare Other | Admitting: Hematology

## 2021-09-04 ENCOUNTER — Ambulatory Visit (HOSPITAL_COMMUNITY): Payer: Medicare Other | Admitting: Hematology

## 2021-09-18 ENCOUNTER — Other Ambulatory Visit (HOSPITAL_COMMUNITY): Payer: Self-pay | Admitting: *Deleted

## 2021-09-18 DIAGNOSIS — D472 Monoclonal gammopathy: Secondary | ICD-10-CM

## 2021-09-18 DIAGNOSIS — C9000 Multiple myeloma not having achieved remission: Secondary | ICD-10-CM

## 2021-09-23 ENCOUNTER — Inpatient Hospital Stay (HOSPITAL_COMMUNITY): Payer: Medicare Other | Attending: Hematology

## 2021-09-23 ENCOUNTER — Other Ambulatory Visit: Payer: Self-pay

## 2021-09-23 DIAGNOSIS — C9000 Multiple myeloma not having achieved remission: Secondary | ICD-10-CM

## 2021-09-23 DIAGNOSIS — I10 Essential (primary) hypertension: Secondary | ICD-10-CM | POA: Insufficient documentation

## 2021-09-23 DIAGNOSIS — Z79899 Other long term (current) drug therapy: Secondary | ICD-10-CM | POA: Diagnosis not present

## 2021-09-23 DIAGNOSIS — Z803 Family history of malignant neoplasm of breast: Secondary | ICD-10-CM | POA: Diagnosis not present

## 2021-09-23 DIAGNOSIS — D72819 Decreased white blood cell count, unspecified: Secondary | ICD-10-CM | POA: Diagnosis not present

## 2021-09-23 DIAGNOSIS — D472 Monoclonal gammopathy: Secondary | ICD-10-CM | POA: Diagnosis present

## 2021-09-23 LAB — CBC WITH DIFFERENTIAL/PLATELET
Abs Immature Granulocytes: 0.01 10*3/uL (ref 0.00–0.07)
Basophils Absolute: 0 10*3/uL (ref 0.0–0.1)
Basophils Relative: 1 %
Eosinophils Absolute: 0.2 10*3/uL (ref 0.0–0.5)
Eosinophils Relative: 5 %
HCT: 39.1 % (ref 39.0–52.0)
Hemoglobin: 12.7 g/dL — ABNORMAL LOW (ref 13.0–17.0)
Immature Granulocytes: 0 %
Lymphocytes Relative: 42 %
Lymphs Abs: 1.5 10*3/uL (ref 0.7–4.0)
MCH: 29.7 pg (ref 26.0–34.0)
MCHC: 32.5 g/dL (ref 30.0–36.0)
MCV: 91.4 fL (ref 80.0–100.0)
Monocytes Absolute: 0.4 10*3/uL (ref 0.1–1.0)
Monocytes Relative: 12 %
Neutro Abs: 1.4 10*3/uL — ABNORMAL LOW (ref 1.7–7.7)
Neutrophils Relative %: 40 %
Platelets: 251 10*3/uL (ref 150–400)
RBC: 4.28 MIL/uL (ref 4.22–5.81)
RDW: 13.4 % (ref 11.5–15.5)
WBC: 3.5 10*3/uL — ABNORMAL LOW (ref 4.0–10.5)
nRBC: 0 % (ref 0.0–0.2)

## 2021-09-23 LAB — COMPREHENSIVE METABOLIC PANEL
ALT: 12 U/L (ref 0–44)
AST: 15 U/L (ref 15–41)
Albumin: 3.8 g/dL (ref 3.5–5.0)
Alkaline Phosphatase: 70 U/L (ref 38–126)
Anion gap: 7 (ref 5–15)
BUN: 10 mg/dL (ref 8–23)
CO2: 29 mmol/L (ref 22–32)
Calcium: 8.9 mg/dL (ref 8.9–10.3)
Chloride: 102 mmol/L (ref 98–111)
Creatinine, Ser: 0.92 mg/dL (ref 0.61–1.24)
GFR, Estimated: >60 mL/min (ref >60)
Glucose, Bld: 108 mg/dL — ABNORMAL HIGH (ref 70–99)
Potassium: 4.1 mmol/L (ref 3.5–5.1)
Sodium: 138 mmol/L (ref 135–145)
Total Bilirubin: 0.3 mg/dL (ref 0.3–1.2)
Total Protein: 7.8 g/dL (ref 6.5–8.1)

## 2021-09-23 LAB — VITAMIN D 25 HYDROXY (VIT D DEFICIENCY, FRACTURES): Vit D, 25-Hydroxy: 26.99 ng/mL — ABNORMAL LOW (ref 30–100)

## 2021-09-24 LAB — KAPPA/LAMBDA LIGHT CHAINS
Kappa free light chain: 19.6 mg/L — ABNORMAL HIGH (ref 3.3–19.4)
Kappa, lambda light chain ratio: 0.02 — ABNORMAL LOW (ref 0.26–1.65)
Lambda free light chains: 956.5 mg/L — ABNORMAL HIGH (ref 5.7–26.3)

## 2021-09-25 LAB — PROTEIN ELECTROPHORESIS, SERUM
A/G Ratio: 1.1 (ref 0.7–1.7)
Albumin ELP: 3.8 g/dL (ref 2.9–4.4)
Alpha-1-Globulin: 0.2 g/dL (ref 0.0–0.4)
Alpha-2-Globulin: 0.6 g/dL (ref 0.4–1.0)
Beta Globulin: 0.9 g/dL (ref 0.7–1.3)
Gamma Globulin: 1.9 g/dL — ABNORMAL HIGH (ref 0.4–1.8)
Globulin, Total: 3.5 g/dL (ref 2.2–3.9)
M-Spike, %: 1.3 g/dL — ABNORMAL HIGH
Total Protein ELP: 7.3 g/dL (ref 6.0–8.5)

## 2021-09-29 ENCOUNTER — Other Ambulatory Visit (HOSPITAL_COMMUNITY): Payer: Self-pay | Admitting: *Deleted

## 2021-09-29 ENCOUNTER — Inpatient Hospital Stay (HOSPITAL_COMMUNITY): Payer: Medicare Other | Admitting: Hematology

## 2021-09-29 ENCOUNTER — Other Ambulatory Visit: Payer: Self-pay

## 2021-09-29 VITALS — BP 134/97 | HR 72 | Temp 98.6°F | Resp 18 | Ht 68.9 in | Wt 190.5 lb

## 2021-09-29 DIAGNOSIS — D472 Monoclonal gammopathy: Secondary | ICD-10-CM

## 2021-09-29 LAB — IMMUNOFIXATION ELECTROPHORESIS
IgA: 87 mg/dL (ref 61–437)
IgG (Immunoglobin G), Serum: 1986 mg/dL — ABNORMAL HIGH (ref 603–1613)
IgM (Immunoglobulin M), Srm: 103 mg/dL (ref 20–172)
Total Protein ELP: 7.4 g/dL (ref 6.0–8.5)

## 2021-09-29 NOTE — Patient Instructions (Addendum)
[place] at [facility] Discharge Instructions   You were seen and examined today by Dr. Delton Coombes.  He reviewed your lab work which is normal/stable.  We will see you back in 3 months - you will have an xray of your skeleton and lab work done prior to your next visit.  Return as scheduled in 3 months.      Thank you for choosing [place] at [facility] to provide your oncology and hematology care.  To afford each patient quality time with our provider, please arrive at least 15 minutes before your scheduled appointment time.   If you have a lab appointment with the [facility] please come in thru the Main Entrance and check in at the main information desk.  You need to re-schedule your appointment should you arrive 10 or more minutes late.  We strive to give you quality time with our providers, and arriving late affects you and other patients whose appointments are after yours.  Also, if you no show three or more times for appointments you may be dismissed from the clinic at the providers discretion.     Again, thank you for choosing [facility].  Our hope is that these requests will decrease the amount of time that you wait before being seen by our physicians.       _____________________________________________________________  Should you have questions after your visit to [facility], please contact our office at [phone] and follow the prompts.  Our office hours are 8:00 a.m. and 4:30 p.m. Monday - Friday.  Please note that voicemails left after 4:00 p.m. may not be returned until the following business day.  We are closed weekends and major holidays.  You do have access to a nurse 24-7, just call the main number to the clinic [phone] and do not press any options, hold on the line and a nurse will answer the phone.    For prescription refill requests, have your pharmacy contact our office and allow  72 hours.    Due to Covid, you will need to wear a mask upon entering the hospital. If you do not have a mask, a mask will be given to you at the Main Entrance upon arrival. For doctor visits, patients may have 1 support person age 54 or older with them. For treatment visits, patients can not have anyone with them due to social distancing guidelines and our immunocompromised population.
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
## 2021-10-03 NOTE — Telephone Encounter (Signed)
Signing encounter, See previous note on 01/17/21

## 2021-12-30 ENCOUNTER — Inpatient Hospital Stay (HOSPITAL_COMMUNITY): Payer: Medicare Other | Attending: Hematology

## 2021-12-30 DIAGNOSIS — D472 Monoclonal gammopathy: Secondary | ICD-10-CM

## 2021-12-30 DIAGNOSIS — C9000 Multiple myeloma not having achieved remission: Secondary | ICD-10-CM | POA: Insufficient documentation

## 2021-12-30 LAB — CBC WITH DIFFERENTIAL/PLATELET
Abs Immature Granulocytes: 0 10*3/uL (ref 0.00–0.07)
Basophils Absolute: 0 10*3/uL (ref 0.0–0.1)
Basophils Relative: 1 %
Eosinophils Absolute: 0.2 10*3/uL (ref 0.0–0.5)
Eosinophils Relative: 5 %
HCT: 38 % — ABNORMAL LOW (ref 39.0–52.0)
Hemoglobin: 12.2 g/dL — ABNORMAL LOW (ref 13.0–17.0)
Immature Granulocytes: 0 %
Lymphocytes Relative: 42 %
Lymphs Abs: 1.7 10*3/uL (ref 0.7–4.0)
MCH: 28.8 pg (ref 26.0–34.0)
MCHC: 32.1 g/dL (ref 30.0–36.0)
MCV: 89.6 fL (ref 80.0–100.0)
Monocytes Absolute: 0.5 10*3/uL (ref 0.1–1.0)
Monocytes Relative: 13 %
Neutro Abs: 1.6 10*3/uL — ABNORMAL LOW (ref 1.7–7.7)
Neutrophils Relative %: 39 %
Platelets: 233 10*3/uL (ref 150–400)
RBC: 4.24 MIL/uL (ref 4.22–5.81)
RDW: 13.4 % (ref 11.5–15.5)
WBC: 4 10*3/uL (ref 4.0–10.5)
nRBC: 0 % (ref 0.0–0.2)

## 2021-12-30 LAB — COMPREHENSIVE METABOLIC PANEL
ALT: 11 U/L (ref 0–44)
AST: 18 U/L (ref 15–41)
Albumin: 3.9 g/dL (ref 3.5–5.0)
Alkaline Phosphatase: 68 U/L (ref 38–126)
Anion gap: 8 (ref 5–15)
BUN: 13 mg/dL (ref 8–23)
CO2: 27 mmol/L (ref 22–32)
Calcium: 9 mg/dL (ref 8.9–10.3)
Chloride: 104 mmol/L (ref 98–111)
Creatinine, Ser: 0.91 mg/dL (ref 0.61–1.24)
GFR, Estimated: >60 mL/min (ref >60)
Glucose, Bld: 91 mg/dL (ref 70–99)
Potassium: 4 mmol/L (ref 3.5–5.1)
Sodium: 139 mmol/L (ref 135–145)
Total Bilirubin: 0.6 mg/dL (ref 0.3–1.2)
Total Protein: 7.9 g/dL (ref 6.5–8.1)

## 2021-12-30 LAB — LACTATE DEHYDROGENASE: LDH: 143 U/L (ref 98–192)

## 2021-12-31 LAB — PROTEIN ELECTROPHORESIS, SERUM
A/G Ratio: 1 (ref 0.7–1.7)
Albumin ELP: 3.8 g/dL (ref 2.9–4.4)
Alpha-1-Globulin: 0.2 g/dL (ref 0.0–0.4)
Alpha-2-Globulin: 0.6 g/dL (ref 0.4–1.0)
Beta Globulin: 1 g/dL (ref 0.7–1.3)
Gamma Globulin: 2 g/dL — ABNORMAL HIGH (ref 0.4–1.8)
Globulin, Total: 3.8 g/dL (ref 2.2–3.9)
M-Spike, %: 1.2 g/dL — ABNORMAL HIGH
Total Protein ELP: 7.6 g/dL (ref 6.0–8.5)

## 2021-12-31 LAB — KAPPA/LAMBDA LIGHT CHAINS
Kappa free light chain: 19 mg/L (ref 3.3–19.4)
Kappa, lambda light chain ratio: 0.02 — ABNORMAL LOW (ref 0.26–1.65)
Lambda free light chains: 987.7 mg/L — ABNORMAL HIGH (ref 5.7–26.3)

## 2021-12-31 LAB — BETA 2 MICROGLOBULIN, SERUM: Beta-2 Microglobulin: 1.3 mg/L (ref 0.6–2.4)

## 2022-01-06 ENCOUNTER — Inpatient Hospital Stay (HOSPITAL_COMMUNITY): Payer: Medicare Other | Admitting: Hematology

## 2022-02-24 ENCOUNTER — Ambulatory Visit (HOSPITAL_COMMUNITY)
Admission: RE | Admit: 2022-02-24 | Discharge: 2022-02-24 | Disposition: A | Discharge status: Home / Self Care | Payer: Medicare Other | Source: Ambulatory Visit | Attending: Hematology | Admitting: Hematology

## 2022-02-24 DIAGNOSIS — D472 Monoclonal gammopathy: Secondary | ICD-10-CM | POA: Diagnosis present

## 2022-02-25 ENCOUNTER — Ambulatory Visit (HOSPITAL_COMMUNITY): Payer: Medicare Other | Admitting: Hematology

## 2022-03-10 ENCOUNTER — Inpatient Hospital Stay (HOSPITAL_COMMUNITY): Payer: Medicare Other | Attending: Hematology | Admitting: Hematology

## 2022-03-10 VITALS — BP 136/84 | HR 64 | Temp 97.2°F | Resp 18 | Ht 69.0 in | Wt 187.4 lb

## 2022-03-10 DIAGNOSIS — I10 Essential (primary) hypertension: Secondary | ICD-10-CM | POA: Diagnosis not present

## 2022-03-10 DIAGNOSIS — Z79899 Other long term (current) drug therapy: Secondary | ICD-10-CM | POA: Insufficient documentation

## 2022-03-10 DIAGNOSIS — C9000 Multiple myeloma not having achieved remission: Secondary | ICD-10-CM | POA: Insufficient documentation

## 2022-03-10 DIAGNOSIS — D472 Monoclonal gammopathy: Secondary | ICD-10-CM

## 2022-03-10 DIAGNOSIS — D72819 Decreased white blood cell count, unspecified: Secondary | ICD-10-CM | POA: Insufficient documentation
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
## 2022-03-10 NOTE — Patient Instructions (Addendum)
[place] at [facility] Discharge Instructions   You were seen and examined today by Dr. Delton Coombes.  He reviewed the results of your lab work from April. The results are normal/stable. The results of your bone scan was also normal.   We will see you back in 4 months - we will repeat lab work one week prior to your visit.    Thank you for choosing [place] at [facility] to provide your oncology and hematology care.  To afford each patient quality time with our provider, please arrive at least 15 minutes before your scheduled appointment time.   If you have a lab appointment with the [facility] please come in thru the Main Entrance and check in at the main information desk.  You need to re-schedule your appointment should you arrive 10 or more minutes late.  We strive to give you quality time with our providers, and arriving late affects you and other patients whose appointments are after yours.  Also, if you no show three or more times for appointments you may be dismissed from the clinic at the providers discretion.     Again, thank you for choosing [facility].  Our hope is that these requests will decrease the amount of time that you wait before being seen by our physicians.       _____________________________________________________________  Should you have questions after your visit to [facility], please contact our office at [phone] and follow the prompts.  Our office hours are 8:00 a.m. and 4:30 p.m. Monday - Friday.  Please note that voicemails left after 4:00 p.m. may not be returned until the following business day.  We are closed weekends and major holidays.  You do have access to a nurse 24-7, just call the main number to the clinic [phone] and do not press any options, hold on the line and a nurse will answer the phone.    For prescription refill requests, have your pharmacy contact our  office and allow 72 hours.    Due to Covid, you will need to wear a mask upon entering the hospital. If you do not have a mask, a mask will be given to you at the Main Entrance upon arrival. For doctor visits, patients may have 1 support person age 44 or older with them. For treatment visits, patients can not have anyone with them due to social distancing guidelines and our immunocompromised population.

## 2022-07-13 ENCOUNTER — Inpatient Hospital Stay: Payer: Medicare Other | Attending: Hematology

## 2022-07-13 DIAGNOSIS — Z79899 Other long term (current) drug therapy: Secondary | ICD-10-CM | POA: Insufficient documentation

## 2022-07-13 DIAGNOSIS — I10 Essential (primary) hypertension: Secondary | ICD-10-CM | POA: Diagnosis not present

## 2022-07-13 DIAGNOSIS — D472 Monoclonal gammopathy: Secondary | ICD-10-CM | POA: Diagnosis not present

## 2022-07-13 DIAGNOSIS — M549 Dorsalgia, unspecified: Secondary | ICD-10-CM | POA: Diagnosis not present

## 2022-07-13 DIAGNOSIS — G8929 Other chronic pain: Secondary | ICD-10-CM | POA: Diagnosis not present

## 2022-07-13 LAB — LACTATE DEHYDROGENASE: LDH: 125 U/L (ref 98–192)

## 2022-07-13 LAB — CBC WITH DIFFERENTIAL/PLATELET
Abs Immature Granulocytes: 0.01 10*3/uL (ref 0.00–0.07)
Basophils Absolute: 0 10*3/uL (ref 0.0–0.1)
Basophils Relative: 1 %
Eosinophils Absolute: 0.1 10*3/uL (ref 0.0–0.5)
Eosinophils Relative: 4 %
HCT: 38 % — ABNORMAL LOW (ref 39.0–52.0)
Hemoglobin: 12.5 g/dL — ABNORMAL LOW (ref 13.0–17.0)
Immature Granulocytes: 0 %
Lymphocytes Relative: 45 %
Lymphs Abs: 1.5 10*3/uL (ref 0.7–4.0)
MCH: 30.5 pg (ref 26.0–34.0)
MCHC: 32.9 g/dL (ref 30.0–36.0)
MCV: 92.7 fL (ref 80.0–100.0)
Monocytes Absolute: 0.4 10*3/uL (ref 0.1–1.0)
Monocytes Relative: 13 %
Neutro Abs: 1.3 10*3/uL — ABNORMAL LOW (ref 1.7–7.7)
Neutrophils Relative %: 37 %
Platelets: 237 10*3/uL (ref 150–400)
RBC: 4.1 MIL/uL — ABNORMAL LOW (ref 4.22–5.81)
RDW: 13 % (ref 11.5–15.5)
WBC: 3.4 10*3/uL — ABNORMAL LOW (ref 4.0–10.5)
nRBC: 0 % (ref 0.0–0.2)

## 2022-07-13 LAB — COMPREHENSIVE METABOLIC PANEL
ALT: 13 U/L (ref 0–44)
AST: 17 U/L (ref 15–41)
Albumin: 3.7 g/dL (ref 3.5–5.0)
Alkaline Phosphatase: 69 U/L (ref 38–126)
Anion gap: 8 (ref 5–15)
BUN: 12 mg/dL (ref 8–23)
CO2: 29 mmol/L (ref 22–32)
Calcium: 9.1 mg/dL (ref 8.9–10.3)
Chloride: 102 mmol/L (ref 98–111)
Creatinine, Ser: 0.88 mg/dL (ref 0.61–1.24)
GFR, Estimated: >60 mL/min (ref >60)
Glucose, Bld: 93 mg/dL (ref 70–99)
Potassium: 4.2 mmol/L (ref 3.5–5.1)
Sodium: 139 mmol/L (ref 135–145)
Total Bilirubin: 0.7 mg/dL (ref 0.3–1.2)
Total Protein: 7.7 g/dL (ref 6.5–8.1)

## 2022-07-14 LAB — KAPPA/LAMBDA LIGHT CHAINS
Kappa free light chain: 20.2 mg/L — ABNORMAL HIGH (ref 3.3–19.4)
Kappa, lambda light chain ratio: 0.02 — ABNORMAL LOW (ref 0.26–1.65)
Lambda free light chains: 824.3 mg/L — ABNORMAL HIGH (ref 5.7–26.3)

## 2022-07-15 LAB — PROTEIN ELECTROPHORESIS, SERUM
A/G Ratio: 1.1 (ref 0.7–1.7)
Albumin ELP: 3.8 g/dL (ref 2.9–4.4)
Alpha-1-Globulin: 0.2 g/dL (ref 0.0–0.4)
Alpha-2-Globulin: 0.6 g/dL (ref 0.4–1.0)
Beta Globulin: 0.9 g/dL (ref 0.7–1.3)
Gamma Globulin: 1.8 g/dL (ref 0.4–1.8)
Globulin, Total: 3.4 g/dL (ref 2.2–3.9)
M-Spike, %: 1.1 g/dL — ABNORMAL HIGH
Total Protein ELP: 7.2 g/dL (ref 6.0–8.5)

## 2022-07-20 ENCOUNTER — Inpatient Hospital Stay: Payer: Medicare Other | Admitting: Hematology

## 2022-07-20 VITALS — BP 134/84 | HR 69 | Temp 98.7°F | Resp 18 | Ht 69.5 in | Wt 178.1 lb

## 2022-07-20 DIAGNOSIS — D472 Monoclonal gammopathy: Secondary | ICD-10-CM

## 2022-07-20 NOTE — Patient Instructions (Signed)
[place] at [facility] Discharge Instructions   You were seen and examined today by Dr. Delton Coombes.  He reviewed the results of your lab work which are normal/stable.   We will see you back in 6 months. We will obtain a skeletal survey and lab work prior to your next visit.    Thank you for choosing [place] at [facility] to provide your oncology and hematology care.  To afford each patient quality time with our provider, please arrive at least 15 minutes before your scheduled appointment time.   If you have a lab appointment with the [facility] please come in thru the Main Entrance and check in at the main information desk.  You need to re-schedule your appointment should you arrive 10 or more minutes late.  We strive to give you quality time with our providers, and arriving late affects you and other patients whose appointments are after yours.  Also, if you no show three or more times for appointments you may be dismissed from the clinic at the providers discretion.     Again, thank you for choosing [facility].  Our hope is that these requests will decrease the amount of time that you wait before being seen by our physicians.       _____________________________________________________________  Should you have questions after your visit to [facility], please contact our office at [phone] and follow the prompts.  Our office hours are 8:00 a.m. and 4:30 p.m. Monday - Friday.  Please note that voicemails left after 4:00 p.m. may not be returned until the following business day.  We are closed weekends and major holidays.  You do have access to a nurse 24-7, just call the main number to the clinic [phone] and do not press any options, hold on the line and a nurse will answer the phone.    For prescription refill requests, have your pharmacy contact our office and allow 72 hours.    Due to Covid, you  will need to wear a mask upon entering the hospital. If you do not have a mask, a mask will be given to you at the Main Entrance upon arrival. For doctor visits, patients may have 1 support person age 68 or older with them. For treatment visits, patients can not have anyone with them due to social distancing guidelines and our immunocompromised population.
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
## 2023-01-18 ENCOUNTER — Inpatient Hospital Stay: Payer: Medicare Other

## 2023-01-19 ENCOUNTER — Inpatient Hospital Stay: Payer: Medicare Other | Attending: Hematology

## 2023-01-25 ENCOUNTER — Inpatient Hospital Stay: Payer: Medicare Other | Attending: Hematology | Admitting: Hematology

## 2024-04-07 ENCOUNTER — Encounter: Payer: Self-pay | Admitting: Advanced Practice Midwife

## 2024-08-07 NOTE — Progress Notes (Signed)
 Chief Complaint:   Chief Complaint  Patient presents with  . Hypertension  . Hyperlipidemia    Subjective  Juan Wall is a 68 y.o. male in today for: History of Present Illness Juan Wall is a 68 year old male with hypertension and chronic pain who presents with elevated blood pressure and pain management issues.  He experiences significant pain in his back and knees, describing it as persistent and impacting his daily life. He received cortisone shots a couple of months ago, which were ineffective. He is currently taking methadone and Tylenol  for pain management but reports that these medications do not alleviate his pain, especially at night, leading to sleep disturbances.  He suffers from chronic insomnia, sleeping only three to four hours per night, attributing this to pain waking him up. He has been taking tizanidine and sleeping pills, but these have not improved his sleep duration.  His blood pressure is usually in the 130s at home and sometimes in the 140s at the doctor's office. He takes his blood pressure medication every night and is concerned about his blood pressure being elevated today, possibly due to the pain he is experiencing.  He is currently taking statins for cholesterol management.   He recalls a heart murmur being identified in the past and mentions undergoing an ultrasound for it., has been a while per pt. I can't find results in EP[facility].  He also notes a history of seeing a hematologist for blood-related issues but has not had a recent appointment. ( 2023 was his last check up on his myeloma)  He discusses changes in his dietary preferences, noting a decreased desire for certain foods he used to enjoy. Family history reveals longevity, with his grandfather living to 70 years.   Hypertension  Hyperlipidemia     Patient Active Problem List  Diagnosis  . Insomnia  . Cardiac murmur  . Facet arthropathy  . Lumbar post-laminectomy syndrome  . Encounter  for monitoring opioid maintenance therapy  . Difficulty sleeping  . Excessive daytime sleepiness  . H/O neutropenia  . Chronic midline low back pain with left-sided sciatica  . Plasma cell neoplasm  . Smoldering myeloma  . Encounter for long-term use of opiate analgesic  . Left lateral knee pain  . Left medial knee pain  . Hypertension, uncontrolled  . Other male erectile dysfunction    Outpatient Medications Prior to Visit  Medication Sig Dispense Refill  . acetaminophen  (TYLENOL ) 650 MG ER tablet Take by mouth.    SABRA amLODIPine (NORVASC) 10 MG tablet Take 1 tablet (10 mg total) by mouth once daily 100 tablet 3  . gabapentin (NEURONTIN) 800 MG tablet Take 1 tablet (800 mg total) by mouth 3 (three) times daily for 90 days 90 tablet 2  . IBUPROFEN ORAL Take by mouth    . lidocaine (SALONPAS) 4 % patch Place 1-2 patches onto the skin as needed    . losartan (COZAAR) 100 MG tablet Take 1 tablet (100 mg total) by mouth once daily 100 tablet 3  . methadone (DOLOPHINE) 10 MG tablet Take 1 tablet (10 mg total) by mouth every 6 (six) hours 120 tablet 0  . naloxone (NARCAN) 4 mg/actuation nasal spray Place 1 spray (4 mg total) into one nostril once as needed (for opioid overdose) for up to 1 dose 1 spray into nose for opioid overdose. May repeat spray in 2-3 min 2 each 1  . rosuvastatin (CRESTOR) 20 MG tablet Take 1 tablet (20 mg total) by mouth  once daily 100 tablet 3  . sildenafil (REVATIO) 20 mg tablet TAKE 3-5 TABLETS BY MOUTH ONCE DAILY PRIOR TO RELATIONS 50 tablet 3  . tiZANidine (ZANAFLEX) 4 MG tablet Take 1-2 tablets (4-8 mg total) by mouth at bedtime as needed 180 tablet 1  . methadone (DOLOPHINE) 10 MG tablet Take 1 tablet (10 mg total) by mouth every 6 (six) hours 120 tablet 0   No facility-administered medications prior to visit.     Objective  Vitals:   08/07/24 0904 08/07/24 0911  BP: (!) 150/76 (!) 145/71  Pulse: 68 68  Weight: 80.8 kg (178 lb 3.2 oz)   PainSc:   8    PainLoc: Back    Body mass index is 28.17 kg/m. Home Vitals:     Physical Exam  Physical Exam Vitals and nursing note reviewed.  Constitutional:      Appearance: Normal appearance. He is well-developed.  Cardiovascular:     Rate and Rhythm: Normal rate and regular rhythm.     Heart sounds: Murmur (2/6 RUSB) heard.  Pulmonary:     Effort: Pulmonary effort is normal.     Breath sounds: Normal breath sounds.  Neurological:     Mental Status: He is alert.  Psychiatric:        Behavior: Behavior normal.      Results   No results found for this visit on 08/07/24.     Assessment/Plan:   Assessment & Plan Chronic pain and osteoarthritis of knees- Followed by pain mgt.  Essential hypertension  - Chronic controlled, no changes. Continue current medical regime. Blood pressure is typically in the 130s at home but elevated during visits, likely due to pain and stress from the drive. Current medication is effective when taken consistently. Rechecked blood pressure after rest and in the 130s range. Continue current antihypertensive medication regimen.  Hypercholesterolemia  - Chronic controlled, no changes. Continue current medical regime. Cholesterol levels, checked in May, are well-managed with current statin therapy. Continue current statin therapy.  insomnia  - chronic uncontrolled see changes in plan as noted. Chronic insomnia limits sleep to 3-4 hours per night, worsened by pain. Current regimen includes methadone and tizanidine, but sleep remains inadequate. Referred to [facility] for evaluation and management.  Monoclonal gammopathy - chronic, status unknown.  Requires regular monitoring by a hematologist. Last seen in October 2023. Schedule follow-up appointment with hematologist. Referral placed again.  Cardiac murmur  - chronic asymptomatic. Referral for recheck placed.  Diagnoses and all orders for this visit:  Essential hypertension  Elevated  cholesterol  Smoldering myeloma -     Ambulatory Referral to Hematology-Malignancy  Chronic insomnia -     Ambulatory Referral to Sleep Specialist  Cardiac murmur -     Ambulatory Referral to Cardiology    This visit was coded based on medical decision making (MDM).           Future Appointments     Date/Time Provider Department Center Visit Type   08/30/2024 12:30 PM (Arrive by 12:00 PM) Shawnie Lorenz LABOR, NP [facility] RETURN VISIT   02/19/2025 8:00 AM (Arrive by 7:45 AM) POPUL[facility] HEALTH NURSE - [facility] [place] [facility] WELLNESS   02/19/2025 8:30 AM (Arrive by 8:15 AM) Jyl Heron Haff, MD [facility] [place] [facility] OFFICE VISIT       There are no Patient Instructions on file for this visit.  An after visit summary was provided for the  patient either in written format (printed) or through [facility].  This note has been created using automated tools and reviewed for accuracy by BARBARA D ALDRIDGE.

## 2024-08-14 ENCOUNTER — Other Ambulatory Visit: Payer: Self-pay | Admitting: *Deleted

## 2024-08-14 DIAGNOSIS — D472 Monoclonal gammopathy: Secondary | ICD-10-CM

## 2024-09-18 ENCOUNTER — Encounter: Payer: Self-pay | Admitting: *Deleted

## 2024-11-10 ENCOUNTER — Inpatient Hospital Stay

## 2024-11-17 ENCOUNTER — Inpatient Hospital Stay: Admitting: Oncology
